# Patient Record
Sex: Male | Born: 1950
Health system: Southern US, Community
[De-identification: ages and names within clinical notes are randomized; demographics above are authoritative.]

## PROBLEM LIST (undated history)

## (undated) DIAGNOSIS — N200 Calculus of kidney: Secondary | ICD-10-CM

## (undated) DIAGNOSIS — I10 Essential (primary) hypertension: Secondary | ICD-10-CM

## (undated) HISTORY — DX: Essential (primary) hypertension: I10

## (undated) HISTORY — DX: Calculus of kidney: N20.0

---

## 1998-02-11 ENCOUNTER — Ambulatory Visit (HOSPITAL_COMMUNITY): Admission: RE | Admit: 1998-02-11 | Discharge: 1998-02-11 | Payer: Self-pay | Admitting: Gastroenterology

## 2000-08-29 ENCOUNTER — Ambulatory Visit (HOSPITAL_COMMUNITY): Admission: RE | Admit: 2000-08-29 | Discharge: 2000-08-30 | Payer: Self-pay | Admitting: Cardiology

## 2006-03-08 ENCOUNTER — Ambulatory Visit: Payer: Self-pay | Admitting: Family Medicine

## 2006-03-14 ENCOUNTER — Ambulatory Visit: Payer: Self-pay | Admitting: Family Medicine

## 2006-06-07 ENCOUNTER — Ambulatory Visit: Payer: Self-pay | Admitting: Family Medicine

## 2006-06-19 ENCOUNTER — Ambulatory Visit: Payer: Self-pay | Admitting: Family Medicine

## 2006-06-19 LAB — CONVERTED CEMR LAB
ALT: 17 units/L (ref 0–40)
Calcium: 9.1 mg/dL (ref 8.4–10.5)
Chloride: 103 meq/L (ref 96–112)
Chol/HDL Ratio, serum: 5.1
Cholesterol: 219 mg/dL (ref 0–200)
GFR calc non Af Amer: 82 mL/min
Glomerular Filtration Rate, Af Am: 99 mL/min/{1.73_m2}
Sodium: 138 meq/L (ref 135–145)
VLDL: 34 mg/dL (ref 0–40)

## 2006-07-05 ENCOUNTER — Ambulatory Visit: Payer: Self-pay | Admitting: Internal Medicine

## 2006-09-11 ENCOUNTER — Ambulatory Visit: Payer: Self-pay | Admitting: Family Medicine

## 2006-09-14 ENCOUNTER — Ambulatory Visit: Payer: Self-pay | Admitting: Family Medicine

## 2006-09-14 LAB — CONVERTED CEMR LAB
BUN: 17 mg/dL (ref 6–23)
Calcium: 9.1 mg/dL (ref 8.4–10.5)
Chloride: 102 meq/L (ref 96–112)
Cholesterol: 197 mg/dL (ref 0–200)
GFR calc non Af Amer: 73 mL/min
Glucose, Bld: 99 mg/dL (ref 70–99)
LDL Cholesterol: 116 mg/dL — ABNORMAL HIGH (ref 0–99)
PSA: 1.58 ng/mL (ref 0.10–4.00)

## 2006-09-27 DIAGNOSIS — E785 Hyperlipidemia, unspecified: Secondary | ICD-10-CM

## 2006-09-27 DIAGNOSIS — Z860100 Personal history of colon polyps, unspecified: Secondary | ICD-10-CM | POA: Insufficient documentation

## 2006-09-27 DIAGNOSIS — K5730 Diverticulosis of large intestine without perforation or abscess without bleeding: Secondary | ICD-10-CM | POA: Insufficient documentation

## 2006-09-27 DIAGNOSIS — D509 Iron deficiency anemia, unspecified: Secondary | ICD-10-CM | POA: Insufficient documentation

## 2006-09-27 DIAGNOSIS — I10 Essential (primary) hypertension: Secondary | ICD-10-CM | POA: Insufficient documentation

## 2006-09-27 DIAGNOSIS — J309 Allergic rhinitis, unspecified: Secondary | ICD-10-CM | POA: Insufficient documentation

## 2006-11-16 ENCOUNTER — Encounter (INDEPENDENT_AMBULATORY_CARE_PROVIDER_SITE_OTHER): Payer: Self-pay | Admitting: Family Medicine

## 2006-11-16 ENCOUNTER — Encounter: Payer: Self-pay | Admitting: Family Medicine

## 2006-11-16 ENCOUNTER — Telehealth (INDEPENDENT_AMBULATORY_CARE_PROVIDER_SITE_OTHER): Payer: Self-pay | Admitting: *Deleted

## 2006-11-16 ENCOUNTER — Ambulatory Visit: Payer: Self-pay | Admitting: Family Medicine

## 2007-03-01 ENCOUNTER — Ambulatory Visit: Payer: Self-pay | Admitting: Family Medicine

## 2007-04-25 ENCOUNTER — Ambulatory Visit: Payer: Self-pay | Admitting: Family Medicine

## 2007-04-29 LAB — CONVERTED CEMR LAB
CO2: 29 meq/L (ref 19–32)
Chloride: 106 meq/L (ref 96–112)
Cholesterol: 158 mg/dL (ref 0–200)
Creatinine, Ser: 1.1 mg/dL (ref 0.4–1.5)
Glucose, Bld: 102 mg/dL — ABNORMAL HIGH (ref 70–99)
HDL: 41.6 mg/dL (ref >39.0)
Potassium: 4.3 meq/L (ref 3.5–5.1)
Sodium: 140 meq/L (ref 135–145)
Triglycerides: 104 mg/dL (ref 0–149)
VLDL: 21 mg/dL (ref 0–40)

## 2007-04-30 ENCOUNTER — Encounter (INDEPENDENT_AMBULATORY_CARE_PROVIDER_SITE_OTHER): Payer: Self-pay | Admitting: *Deleted

## 2007-04-30 ENCOUNTER — Telehealth (INDEPENDENT_AMBULATORY_CARE_PROVIDER_SITE_OTHER): Payer: Self-pay | Admitting: *Deleted

## 2007-08-11 ENCOUNTER — Ambulatory Visit: Payer: Self-pay | Admitting: Family Medicine

## 2007-08-11 DIAGNOSIS — J209 Acute bronchitis, unspecified: Secondary | ICD-10-CM | POA: Insufficient documentation

## 2007-11-11 ENCOUNTER — Emergency Department (HOSPITAL_COMMUNITY): Admission: EM | Admit: 2007-11-11 | Discharge: 2007-11-12 | Payer: Self-pay | Admitting: Emergency Medicine

## 2008-01-11 ENCOUNTER — Telehealth (INDEPENDENT_AMBULATORY_CARE_PROVIDER_SITE_OTHER): Payer: Self-pay | Admitting: *Deleted

## 2008-01-23 ENCOUNTER — Encounter (INDEPENDENT_AMBULATORY_CARE_PROVIDER_SITE_OTHER): Payer: Self-pay | Admitting: *Deleted

## 2010-11-19 NOTE — Cardiovascular Report (Signed)
[facility]  Patient:    Steven Benjamin, Steven Benjamin                         MRN: [id number] Proc. Date: 08/29/00 Adm. Date:  [id number] Attending:  Armanda Magic CC:         Lilyan Punt. Sydnee Levans, M.D.   Cardiac Catheterization  PROCEDURE:  Left heart catheterization.  CARDIOLOGIST:  Armanda Magic, M.D.  INDICATIONS:  Abnormal echo and chest pain.  COMPLICATIONS:  None.  HISTORY:  This is a 60 year old white male with a history of lower extremity edema on and off for the past year.  About four weeks ago, he developed some problems with kidney stones and since then has been having some problems with left-sided chest pain with radiation to the left shoulder and neck improved with nitroglycerin.  A 2-D echocardiogram done recently showed inferior and posterior apical hypokinesis to akinesis with dilated left ventricle.  He now presents for cardiac catheterization.  DESCRIPTION OF PROCEDURE:  The patient was brought to the cardiac catheterization laboratory in the fasting state.  Informed consent was obtained.  The patient was connected to continuous heart rate and pulse oximeter monitoring and intermittent blood pressure monitoring.  The right groin was prepped and draped in a sterile fashion.  Lidocaine 1% was used for local anesthesia.  Using modified Seldinger technique, a 6-French sheath was placed in the right femoral artery.  Under fluoroscopic guidance, a 6-French JL4 catheter was placed in the left coronary artery.  Multiple cine films were taken in the RAO and LAO positions.  This catheter was then exchanged out over a guidewire for a 6-French JR4 catheter which was placed in the right coronary artery.  Multiple cine films were taken in the RAO and LAO positions.  This catheter was then exchanged over a guidewire for a 6-French angled pigtail catheter which was placed into the left ventricular cavity.  Left ventriculography was performed in the RAO and  LAO positions using 30 cc of IV contrast for the RAO projection and 24 cc for the LAO position.  The catheter was then pulled back across the aortic valve, and no significant gradient was obtained.  The catheter and sheath were then removed.  Manual compression was performed until adequate hemostasis was obtained.  The patient was then transferred back to his room in stable condition.  RESULTS:  The left main coronary artery is widely patent and bifurcates in left anterior descending and left circumflex artery.  The left anterior descending artery gives rise to a large diagonal branch which is widely patent and then two smaller diagonal branches, both of which are patent.  The left anterior descending artery traverses to the apex and is widely patent throughout its course.  The left circumflex artery gives rise to a very small first obtuse marginal branch, and then a large left circumflex artery terminates in three daughter branches, all of which are widely patent.  The right coronary artery gives rise to a right ventricular marginal branch which is widely patent.  The right coronary artery is widely patent throughout its course and then bifurcates into a posterior descending artery and posterolateral artery, both of which are widely patent.  Left ventriculography performed in the RAO and LAO position shows normal left ventricular size and function, no wall motion abnormalities.  IMPRESSION: 1. Normal coronary arteries, noncardiac chest pain. 2. Normal left ventricular size and function, no wall motion abnormalities.  PLAN:  Will discharge to home in the morning. DD:  08/29/00 TD:  08/30/00 Job: [id number]

## 2011-08-15 ENCOUNTER — Other Ambulatory Visit: Payer: Self-pay

## 2011-08-15 MED ORDER — SIMVASTATIN 20 MG PO TABS: 20.0000 mg | ORAL_TABLET | Freq: Every day | ORAL | Status: DC

## 2011-09-15 ENCOUNTER — Other Ambulatory Visit: Payer: Self-pay | Admitting: *Deleted

## 2011-09-15 MED ORDER — LISINOPRIL 20 MG PO TABS: 40.0000 mg | ORAL_TABLET | Freq: Every day | ORAL | Status: DC

## 2011-09-17 ENCOUNTER — Ambulatory Visit (INDEPENDENT_AMBULATORY_CARE_PROVIDER_SITE_OTHER): Payer: BC Managed Care – PPO | Admitting: Emergency Medicine

## 2011-09-17 ENCOUNTER — Ambulatory Visit: Payer: BC Managed Care – PPO

## 2011-09-17 VITALS — BP 200/104 | HR 71 | Temp 98.5°F | Resp 16 | Ht 65.0 in | Wt 210.0 lb

## 2011-09-17 DIAGNOSIS — E785 Hyperlipidemia, unspecified: Secondary | ICD-10-CM

## 2011-09-17 DIAGNOSIS — I10 Essential (primary) hypertension: Secondary | ICD-10-CM

## 2011-09-17 DIAGNOSIS — M79609 Pain in unspecified limb: Secondary | ICD-10-CM

## 2011-09-17 DIAGNOSIS — M79673 Pain in unspecified foot: Secondary | ICD-10-CM

## 2011-09-17 DIAGNOSIS — M79675 Pain in left toe(s): Secondary | ICD-10-CM

## 2011-09-17 LAB — COMPREHENSIVE METABOLIC PANEL
ALT: 20 U/L (ref 0–53)
AST: 26 U/L (ref 0–37)
Albumin: 4.4 g/dL (ref 3.5–5.2)
CO2: 27 mEq/L (ref 19–32)
Calcium: 9.3 mg/dL (ref 8.4–10.5)
Chloride: 102 mEq/L (ref 96–112)
Creat: 0.96 mg/dL (ref 0.50–1.35)
Potassium: 4.4 mEq/L (ref 3.5–5.3)
Total Protein: 7.5 g/dL (ref 6.0–8.3)

## 2011-09-17 LAB — URIC ACID: Uric Acid, Serum: 6.2 mg/dL (ref 4.0–7.8)

## 2011-09-17 LAB — LIPID PANEL
Cholesterol: 153 mg/dL (ref 0–200)
Total CHOL/HDL Ratio: 2.4 Ratio

## 2011-09-17 MED ORDER — SIMVASTATIN 20 MG PO TABS: 20.0000 mg | ORAL_TABLET | Freq: Every day | ORAL | Status: DC

## 2011-09-17 MED ORDER — LISINOPRIL 20 MG PO TABS: 40.0000 mg | ORAL_TABLET | Freq: Every day | ORAL | Status: DC

## 2011-09-17 NOTE — Progress Notes (Signed)
  Subjective:    Patient ID: Steven Slade Sr., male    DOB: 1950-08-06, 61 y.o.   MRN: [id number]  HPI patient was in good health until approximately 8 days ago when trying to get out of his bathtub he struck his left great toe on the side of the tub. He has had persistent pain in the MtP joint of his left great toe he also has been having discomfort in his foot he thinks because it's been difficult to walk..    Review of Systems patient under treatment for high cholesterol and high blood pressure. He states he has not been taking his medications regular because he was about to run out.     Objective:   Physical Exam  Constitutional: He appears well-developed and well-nourished.  HENT:  Head: Normocephalic.  Eyes: Pupils are equal, round, and reactive to light.  Neck: No JVD present. No tracheal deviation present. No thyromegaly present.  Cardiovascular: Normal rate, regular rhythm and normal heart sounds.   Musculoskeletal:       There is tenderness over the MTP joint of the great toe  Lymphadenopathy:    He has no cervical adenopathy.      [facility] reading (PRIMARY) by  Dr.Blayke Cordrey x-ray reveals no fracture but cystic changes of the MCP joint. These changes would be consistent with gout      Assessment & Plan:   history would say that this is injury to his foot. Check labs for gout. Have also done a lipids and cmet t because he is on a blood pressure medicine and cholesterol medicines.

## 2012-02-11 ENCOUNTER — Other Ambulatory Visit: Payer: Self-pay | Admitting: Emergency Medicine

## 2012-02-18 ENCOUNTER — Other Ambulatory Visit: Payer: Self-pay | Admitting: Emergency Medicine

## 2012-03-08 ENCOUNTER — Encounter: Payer: Self-pay | Admitting: Family Medicine

## 2012-03-08 ENCOUNTER — Ambulatory Visit (INDEPENDENT_AMBULATORY_CARE_PROVIDER_SITE_OTHER): Payer: BC Managed Care – PPO | Admitting: Family Medicine

## 2012-03-08 VITALS — BP 150/88 | HR 80 | Temp 98.1°F | Resp 16 | Ht 65.0 in | Wt 207.4 lb

## 2012-03-08 DIAGNOSIS — I10 Essential (primary) hypertension: Secondary | ICD-10-CM

## 2012-03-08 DIAGNOSIS — E785 Hyperlipidemia, unspecified: Secondary | ICD-10-CM

## 2012-03-08 DIAGNOSIS — Z760 Encounter for issue of repeat prescription: Secondary | ICD-10-CM

## 2012-03-08 MED ORDER — SIMVASTATIN 20 MG PO TABS: 20.0000 mg | ORAL_TABLET | Freq: Every day | ORAL | Status: DC

## 2012-03-08 MED ORDER — LISINOPRIL 20 MG PO TABS: 40.0000 mg | ORAL_TABLET | Freq: Every day | ORAL | Status: DC

## 2012-03-08 NOTE — Patient Instructions (Signed)
Shingles Vaccine What You Need to Know WHAT IS SHINGLES?  Shingles is a painful skin rash, often with blisters. It is also called Herpes Zoster or just Zoster.   A shingles rash usually appears on one side of the face or body and lasts from 2 to 4 weeks. Its main symptom is pain, which can be quite severe. Other symptoms of shingles can include fever, headache, chills, and upset stomach. Very rarely, a shingles infection can lead to pneumonia, hearing problems, blindness, brain inflammation (encephalitis), or death.   For about 1 person in 5, severe pain can continue even after the rash clears up. This is called post-herpetic neuralgia.   Shingles is caused by the Varicella Zoster virus. This is the same virus that causes chickenpox. Only someone who has had a case of chickenpox or rarely, has gotten chickenpox vaccine, can get shingles. The virus stays in your body. It can reappear many years later to cause a case of shingles.   You cannot catch shingles from another person with shingles. However, a person who has never had chickenpox (or chickenpox vaccine) could get chickenpox from someone with shingles. This is not very common.   Shingles is far more common in people 50 and older than in younger people. It is also more common in people whose immune systems are weakened because of a disease such as cancer or drugs such as steroids or chemotherapy.   At least 1 million people get shingles per year in the [place].  SHINGLES VACCINE  A vaccine for shingles was licensed in 2006. In clinical trials, the vaccine reduced the risk of shingles by 50%. It can also reduce the pain in people who still get shingles after being vaccinated.   A single dose of shingles vaccine is recommended for adults 60 years of age and older.  SOME PEOPLE SHOULD NOT GET SHINGLES VACCINE OR SHOULD WAIT A person should not get shingles vaccine if he or she:  Has ever had a life-threatening allergic reaction to  gelatin, the antibiotic neomycin, or any other component of shingles vaccine. Tell your caregiver if you have any severe allergies.   Has a weakened immune system because of current:   AIDS or another disease that affects the immune system.   Treatment with drugs that affect the immune system, such as prolonged use of high-dose steroids.   Cancer treatment, such as radiation or chemotherapy.   Cancer affecting the bone marrow or lymphatic system, such as leukemia or lymphoma.   Is pregnant, or might be pregnant. Women should not become pregnant until at least 4 weeks after getting shingles vaccine.  Someone with a minor illness, such as a cold, may be vaccinated. Anyone with a moderate or severe acute illness should usually wait until he or she recovers before getting the vaccine. This includes anyone with a temperature of 101.3 F (38 C) or higher. WHAT ARE THE RISKS FROM SHINGLES VACCINE?  A vaccine, like any medicine, could possibly cause serious problems, such as severe allergic reactions. However, the risk of a vaccine causing serious harm, or death, is extremely small.   No serious problems have been identified with shingles vaccine.  Mild Problems  Redness, soreness, swelling, or itching at the site of the injection (about 1 person in 3).   Headache (about 1 person in 70).  Like all vaccines, shingles vaccine is being closely monitored for unusual or severe problems. WHAT IF THERE IS A MODERATE OR SEVERE REACTION? What should I   look for? Any unusual condition, such as a severe allergic reaction or a high fever. If a severe allergic reaction occurred, it would be within a few minutes to an hour after the shot. Signs of a serious allergic reaction can include difficulty breathing, weakness, hoarseness or wheezing, a fast heartbeat, hives, dizziness, paleness, or swelling of the throat. What should I do?  Call your caregiver, or get the person to a caregiver right away.   Tell  the caregiver what happened, the date and time it happened, and when the vaccination was given.   Ask the caregiver to report the reaction by filing a Vaccine Adverse Event Reporting System (VAERS) form. Or, you can file this report through the VAERS web site at www.vaers.hhs.gov or by calling 1-800-822-7967.  VAERS does not provide medical advice. HOW CAN I LEARN MORE?  Ask your caregiver. He or she can give you the vaccine package insert or suggest other sources of information.   Contact the Centers for Disease Control and Prevention (CDC):   Call 1-800-232-4636 (1-800-CDC-INFO).   Visit the CDC website at www.cdc.gov/vaccines  CDC Shingles Vaccine VIS (04/08/08) Document Released: 04/17/2006 Document Revised: 06/09/2011 Document Reviewed: 04/08/2008 ExitCare Patient Information 2012 [employer]. 

## 2012-03-08 NOTE — Progress Notes (Signed)
S; This 61 y.o. Cauc male returns for medication refills (notification by pharmacy). He is compliant with medications without side effects; he could not tolerate Atorvastatin which caused myalgias. He denies diaphoresis, chest tightness or pain, SOB, edema, cough, HA, dizziness, numbness, weakness or syncope. 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  ROS: As per HPI.  O:  Filed Vitals:   03/08/12 1405  BP: 150/88                                                BP: 200/104 in March 2013  Pulse: 80  Temp: 98.1 F (36.7 C)  Resp: 16   GEN: In NAD; WN,WD> HENT: [place]/AT; EOMI, conj/scl clear. LUNGS: Normal resp rate and effort. COR: RRR NEURO: A&O x 3; CNs intact; otherwise, nonfocal.  LABS: Lipids- 02/2011    TChol= 240   TGs=294   HDL= 47   LDL= 134  A/P:  1. HTN (hypertension)  RF: Lisinopril 20 mg  #180  1 RF  2. Medication refill   3. Hyperlipidemia RF: Simvastatin 20 mg  #90   1RF   Advised to schedule CPE  Within next 6 months. Pt plans to do this once he relocates to [place], [employer]. RX: Zostavax- 1 dose  given to pt.

## 2014-09-25 DIAGNOSIS — Z9119 Patient's noncompliance with other medical treatment and regimen: Secondary | ICD-10-CM | POA: Diagnosis not present

## 2014-09-25 DIAGNOSIS — Z1389 Encounter for screening for other disorder: Secondary | ICD-10-CM | POA: Diagnosis not present

## 2014-09-25 DIAGNOSIS — I10 Essential (primary) hypertension: Secondary | ICD-10-CM | POA: Diagnosis not present

## 2014-09-25 DIAGNOSIS — E780 Pure hypercholesterolemia: Secondary | ICD-10-CM | POA: Diagnosis not present

## 2014-09-25 DIAGNOSIS — Z9181 History of falling: Secondary | ICD-10-CM | POA: Diagnosis not present

## 2014-09-25 DIAGNOSIS — Z2821 Immunization not carried out because of patient refusal: Secondary | ICD-10-CM | POA: Diagnosis not present

## 2014-09-25 DIAGNOSIS — Z79899 Other long term (current) drug therapy: Secondary | ICD-10-CM | POA: Diagnosis not present

## 2015-04-22 DIAGNOSIS — L989 Disorder of the skin and subcutaneous tissue, unspecified: Secondary | ICD-10-CM | POA: Diagnosis not present

## 2015-05-19 DIAGNOSIS — C44319 Basal cell carcinoma of skin of other parts of face: Secondary | ICD-10-CM | POA: Diagnosis not present

## 2015-06-23 DIAGNOSIS — J209 Acute bronchitis, unspecified: Secondary | ICD-10-CM | POA: Diagnosis not present

## 2015-06-29 ENCOUNTER — Ambulatory Visit (INDEPENDENT_AMBULATORY_CARE_PROVIDER_SITE_OTHER): Payer: Medicare Other

## 2015-06-29 ENCOUNTER — Ambulatory Visit (INDEPENDENT_AMBULATORY_CARE_PROVIDER_SITE_OTHER): Payer: Medicare Other | Admitting: Physician Assistant

## 2015-06-29 VITALS — BP 218/138 | HR 92 | Temp 98.1°F | Resp 18 | Ht 65.0 in | Wt 213.0 lb

## 2015-06-29 DIAGNOSIS — I10 Essential (primary) hypertension: Secondary | ICD-10-CM | POA: Diagnosis not present

## 2015-06-29 DIAGNOSIS — R05 Cough: Secondary | ICD-10-CM

## 2015-06-29 DIAGNOSIS — R030 Elevated blood-pressure reading, without diagnosis of hypertension: Secondary | ICD-10-CM | POA: Diagnosis not present

## 2015-06-29 DIAGNOSIS — R062 Wheezing: Secondary | ICD-10-CM

## 2015-06-29 DIAGNOSIS — IMO0001 Reserved for inherently not codable concepts without codable children: Secondary | ICD-10-CM

## 2015-06-29 DIAGNOSIS — J209 Acute bronchitis, unspecified: Secondary | ICD-10-CM

## 2015-06-29 DIAGNOSIS — R059 Cough, unspecified: Secondary | ICD-10-CM

## 2015-06-29 LAB — COMPREHENSIVE METABOLIC PANEL
ALBUMIN: 4.3 g/dL (ref 3.6–5.1)
ALT: 30 U/L (ref 9–46)
AST: 23 U/L (ref 10–35)
Alkaline Phosphatase: 69 U/L (ref 40–115)
BUN: 20 mg/dL (ref 7–25)
CALCIUM: 9.7 mg/dL (ref 8.6–10.3)
CHLORIDE: 101 mmol/L (ref 98–110)
CO2: 26 mmol/L (ref 20–31)
Creat: 0.93 mg/dL (ref 0.70–1.25)
Glucose, Bld: 100 mg/dL — ABNORMAL HIGH (ref 65–99)
Potassium: 4.5 mmol/L (ref 3.5–5.3)
SODIUM: 136 mmol/L (ref 135–146)
Total Bilirubin: 0.5 mg/dL (ref 0.2–1.2)
Total Protein: 8 g/dL (ref 6.1–8.1)

## 2015-06-29 LAB — POCT CBC
GRANULOCYTE PERCENT: 77.1 % (ref 37–80)
HEMATOCRIT: 45.4 % (ref 43.5–53.7)
Hemoglobin: 15.2 g/dL (ref 14.1–18.1)
Lymph, poc: 2.4 (ref 0.6–3.4)
MCH: 28.5 pg (ref 27–31.2)
MCHC: 33.6 g/dL (ref 31.8–35.4)
MCV: 84.9 fL (ref 80–97)
MID (CBC): 0.8 (ref 0–0.9)
MPV: 6.6 fL (ref 0–99.8)
POC GRANULOCYTE: 10.9 — AB (ref 2–6.9)
POC LYMPH %: 17.3 % (ref 10–50)
POC MID %: 5.6 %M (ref 0–12)
Platelet Count, POC: 340 10*3/uL (ref 142–424)
RBC: 5.34 M/uL (ref 4.69–6.13)
RDW, POC: 12.8 %
WBC: 14.1 10*3/uL — AB (ref 4.6–10.2)

## 2015-06-29 LAB — TROPONIN I: Troponin I: 0.03 ng/mL (ref <0.06)

## 2015-06-29 MED ORDER — AMLODIPINE BESYLATE 10 MG PO TABS: 10.0000 mg | ORAL_TABLET | Freq: Every day | ORAL | Status: DC

## 2015-06-29 MED ORDER — DOXYCYCLINE HYCLATE 100 MG PO CAPS: 100.0000 mg | ORAL_CAPSULE | Freq: Two times a day (BID) | ORAL | Status: AC

## 2015-06-29 MED ORDER — IPRATROPIUM BROMIDE 0.02 % IN SOLN
0.5000 mg | Freq: Once | RESPIRATORY_TRACT | Status: AC
Start: 2015-06-29 — End: 2015-06-29
  Administered 2015-06-29: 0.5 mg via RESPIRATORY_TRACT

## 2015-06-29 MED ORDER — PREDNISONE 20 MG PO TABS: ORAL_TABLET | ORAL | Status: DC

## 2015-06-29 MED ORDER — ALBUTEROL SULFATE HFA 108 (90 BASE) MCG/ACT IN AERS: 2.0000 | INHALATION_SPRAY | RESPIRATORY_TRACT | Status: DC | PRN

## 2015-06-29 MED ORDER — HYDROCOD POLST-CPM POLST ER 10-8 MG/5ML PO SUER: 5.0000 mL | Freq: Two times a day (BID) | ORAL | Status: DC | PRN

## 2015-06-29 MED ORDER — ALBUTEROL SULFATE (2.5 MG/3ML) 0.083% IN NEBU
2.5000 mg | INHALATION_SOLUTION | Freq: Once | RESPIRATORY_TRACT | Status: AC
Start: 2015-06-29 — End: 2015-06-29
  Administered 2015-06-29: 2.5 mg via RESPIRATORY_TRACT

## 2015-06-29 NOTE — Patient Instructions (Signed)
Stop the levaquin. Start doxycycline twice a day for 10 days. Eat yogurt or take probiotic to avoid GI upset. Cough at night. Albuterol inhaler scheduled every 4 hours for the next 2 days, then as needed for wheezing. Prednisone as directed in the mornings.  Take 1/2 tab amlodipine for 4 days, then increase to full tab. Return in 1 week for follow up blood pressure.

## 2015-06-29 NOTE — Progress Notes (Signed)
[facility] [address],  [place] [address] [phone]  Date:  06/29/2015   Name:  Steven KOCIK Sr.   DOB:  [birth year]   MRN:  [id number]  PCP:  Robyn Haber, MD    Chief Complaint: Cough; Wheezing; and Fever   History of Present Illness:  This is a 64 y.o. male with PMH HLD, anemia, allergic rhinitis who is presenting with cough and wheezing x 2 weeks. Was seen at an Urgent Care in [place] 1 week ago and given levaquin 500 mg QD x 10 days. Has 4 more tabs left. He was also given steroid injection. He feels his symptoms have worsened since starting. Cough is somewhat productive. Having nasal congestion, right otalgia. Denies sore throat. He has had intermittent subj fever although wife states temp usually 97-98. No history of asthma. Has had wheezing before with bronchitis.  One week ago BP 202/170s when he was seen at urgent car for cough. Didn't have an EKG done. He states the provider wanted to send him to the ED but eventually sent him home since his BP came down some on recheck. Is having some CP with coughing only. Denies sob, blurred vision, headache, dizziness, palps. No problems with activity. He has a PCP in [place], Dr. Lin Landsman. Sees him 1-2x per year. Takes lisinopril 40 mg QD. States he was on HCTZ 12.5 mg at one time but no longer because "it didn't work". No hx CAD. He used to be a patient here 3 years ago. He is interested in making [place] his primary care again.  Review of Systems:  Review of Systems See HPI  Patient Active Problem List   Diagnosis Date Noted  . ACUTE BRONCHITIS 08/11/2007  . HYPERLIPIDEMIA 09/27/2006  . ANEMIA-IRON DEFICIENCY 09/27/2006  . HYPERTENSION 09/27/2006  . ALLERGIC RHINITIS 09/27/2006  . DIVERTICULOSIS, COLON 09/27/2006  . COLONIC POLYPS, HX OF 09/27/2006    Prior to Admission medications   Medication Sig Start Date End Date Taking? Authorizing Provider  cetirizine (ZYRTEC) 10 MG tablet Take 10 mg by mouth  daily.   Yes Historical Provider, MD  levofloxacin (LEVAQUIN) 500 MG tablet Take 500 mg by mouth daily.   Yes Historical Provider, MD  lisinopril (PRINIVIL,ZESTRIL) 20 MG tablet Take 2 tablets (40 mg total) by mouth daily. 03/08/12  Yes Barton Fanny, MD  simvastatin (ZOCOR) 20 MG tablet Take 1 tablet (20 mg total) by mouth at bedtime. 03/08/12  Yes Barton Fanny, MD    Allergies  Allergen Reactions  . Lipitor [Atorvastatin Calcium] Other (See Comments)    Muscle pains  . Z-Pak [Azithromycin]   . Erythromycin Rash    History reviewed. No pertinent past surgical history.  Social History  Substance Use Topics  . Smoking status: Former Research scientist (life sciences)  . Smokeless tobacco: None  . Alcohol Use: No    Family History  Problem Relation Age of Onset  . Cancer Mother   . Heart disease Father     Medication list has been reviewed and updated.  Physical Examination:  Physical Exam  Constitutional: He is oriented to person, place, and time. He appears well-developed and well-nourished. No distress.  HENT:  Head: Normocephalic and atraumatic.  Right Ear: Hearing, tympanic membrane, external ear and ear canal normal.  Left Ear: Hearing, tympanic membrane, external ear and ear canal normal.  Nose: Nose normal. Right sinus exhibits no maxillary sinus tenderness and no frontal sinus tenderness. Left sinus exhibits no maxillary sinus tenderness and no  frontal sinus tenderness.  Mouth/Throat: Uvula is midline, oropharynx is clear and moist and mucous membranes are normal.  Eyes: Conjunctivae and lids are normal. Right eye exhibits no discharge. Left eye exhibits no discharge. No scleral icterus.  Cardiovascular: Normal rate, regular rhythm, normal heart sounds and normal pulses.   No murmur heard. Pulmonary/Chest: Effort normal. No respiratory distress. He has wheezes (throughout). He has rhonchi. He has no rales.  Wheezing and symptoms mildly improved after duoneb  Musculoskeletal: Normal  range of motion.  Lymphadenopathy:       Head (right side): No submental, no submandibular and no tonsillar adenopathy present.       Head (left side): No submental, no submandibular and no tonsillar adenopathy present.    He has no cervical adenopathy.  Neurological: He is alert and oriented to person, place, and time.  Skin: Skin is warm, dry and intact. No lesion and no rash noted.  Psychiatric: He has a normal mood and affect. His speech is normal and behavior is normal. Thought content normal.   BP 180/124 mmHg  Pulse 94  Temp(Src) 98.1 F (36.7 C) (Oral)  Resp 18  Ht 5\' 5"  (1.651 m)  Wt 213 lb (96.616 kg)  BMI 35.44 kg/m2  SpO2 96%  Results for orders placed or performed in visit on 06/29/15  POCT CBC  Result Value Ref Range   WBC 14.1 (A) 4.6 - 10.2 K/uL   Lymph, poc 2.4 0.6 - 3.4   POC LYMPH PERCENT 17.3 10 - 50 %L   MID (cbc) 0.8 0 - 0.9   POC MID % 5.6 0 - 12 %M   POC Granulocyte 10.9 (A) 2 - 6.9   Granulocyte percent 77.1 37 - 80 %G   RBC 5.34 4.69 - 6.13 M/uL   Hemoglobin 15.2 14.1 - 18.1 g/dL   HCT, POC 45.4 43.5 - 53.7 %   MCV 84.9 80 - 97 fL   MCH, POC 28.5 27 - 31.2 pg   MCHC 33.6 31.8 - 35.4 g/dL   RDW, POC 12.8 %   Platelet Count, POC 340 142 - 424 K/uL   MPV 6.6 0 - 99.8 fL   EKG interpreted with Dr. Lorelei Pont: NSR with possible left atrial enlargement. No signs of ischemia  [facility] reading (PRIMARY) by  Dr. Lorelei Pont: negative  Assessment and Plan:  1. Elevated BP 2. Essential hypertension BP very elevated. EKG without ischemia. Troponin negative. He has been taking lisinopril 40 mg QD. Will add amlodipine - he will take 5 mg QD x 4 days and then increase in 10 mg QD thereafter. Return in 1 week for recheck. He plans to transfer his primary care to our office. - Comprehensive metabolic panel - EKG [id number]  3. Acute bronchitis, unspecified organism 4. Wheezing 5. Cough Worsening cough and wheezing despite being on levaquin. CXR negative. Duoneb with  some improvement in wheezing. Switch to doxy, stop levo. rx'd oral prednisone taper. He will schedule albuterol every 4 hours for next 48 hours and then prn thereafter. Will follow up in 1 week when we recheck his BP. Return before if symptoms change/worsen. - albuterol (PROVENTIL) (2.5 MG/3ML) 0.083% nebulizer solution 2.5 mg; Take 3 mLs (2.5 mg total) by nebulization once. - ipratropium (ATROVENT) nebulizer solution 0.5 mg; Take 2.5 mLs (0.5 mg total) by nebulization once. - POCT CBC - DG Chest 2 View; Future   Benjaman Pott. Drenda Freeze, MHS [facility]  06/29/2015

## 2015-07-02 ENCOUNTER — Telehealth: Payer: Self-pay | Admitting: Physician Assistant

## 2015-07-02 NOTE — Telephone Encounter (Signed)
Spoke with patient. His cough is getting much better. He does not have a monitor at home so has not been checking BP but states he is feeling ok and doing well on amlodipine so far.  He is going to return on 07/07/15 to follow up with Tor Netters.

## 2015-07-07 ENCOUNTER — Ambulatory Visit (INDEPENDENT_AMBULATORY_CARE_PROVIDER_SITE_OTHER): Payer: Medicare Other | Admitting: Family Medicine

## 2015-07-07 VITALS — BP 155/87 | HR 105 | Temp 99.3°F | Resp 16 | Ht 65.25 in | Wt 211.4 lb

## 2015-07-07 DIAGNOSIS — I10 Essential (primary) hypertension: Secondary | ICD-10-CM

## 2015-07-07 DIAGNOSIS — J209 Acute bronchitis, unspecified: Secondary | ICD-10-CM
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
## 2015-07-11 ENCOUNTER — Encounter: Payer: Self-pay | Admitting: Family Medicine

## 2015-07-22 DIAGNOSIS — Z85828 Personal history of other malignant neoplasm of skin: Secondary | ICD-10-CM | POA: Diagnosis not present

## 2015-08-11 ENCOUNTER — Encounter: Payer: Medicare Other | Admitting: Family Medicine

## 2015-08-14 ENCOUNTER — Ambulatory Visit (INDEPENDENT_AMBULATORY_CARE_PROVIDER_SITE_OTHER): Payer: Medicare Other

## 2015-08-14 ENCOUNTER — Ambulatory Visit (INDEPENDENT_AMBULATORY_CARE_PROVIDER_SITE_OTHER): Payer: Medicare Other | Admitting: Family Medicine

## 2015-08-14 VITALS — BP 132/80 | HR 112 | Temp 98.7°F | Resp 20 | Ht 65.75 in | Wt 213.6 lb

## 2015-08-14 DIAGNOSIS — R109 Unspecified abdominal pain: Secondary | ICD-10-CM | POA: Diagnosis not present

## 2015-08-14 DIAGNOSIS — Z87442 Personal history of urinary calculi: Secondary | ICD-10-CM | POA: Diagnosis not present

## 2015-08-14 DIAGNOSIS — S39012A Strain of muscle, fascia and tendon of lower back, initial encounter: Secondary | ICD-10-CM | POA: Diagnosis not present

## 2015-08-14 LAB — POCT URINALYSIS DIP (MANUAL ENTRY)
BILIRUBIN UA: NEGATIVE
Bilirubin, UA: NEGATIVE
Glucose, UA: NEGATIVE
Nitrite, UA: NEGATIVE
PH UA: 5.5
RBC UA: NEGATIVE
SPEC GRAV UA: 1.01
Urobilinogen, UA: 0.2

## 2015-08-14 LAB — POC MICROSCOPIC URINALYSIS (UMFC): Mucus: ABSENT

## 2015-08-14 MED ORDER — HYDROCODONE-ACETAMINOPHEN 5-325 MG PO TABS: 1.0000 | ORAL_TABLET | Freq: Four times a day (QID) | ORAL | Status: DC | PRN

## 2015-08-14 NOTE — Patient Instructions (Signed)
Because you received an x-ray today, you will receive an invoice from [facility]. Please contact [facility] at [phone] with questions or concerns regarding your invoice. Our billing staff will not be able to assist you with those questions. °

## 2015-08-14 NOTE — Progress Notes (Signed)
Subjective:    Patient ID: Steven Polio Sr., male    DOB: 1950/10/05, 65 y.o.   MRN: [id number]  08/14/2015  Nephrolithiasis   HPI This 65 y.o. male presents for evaluation of R>L back pain sharp pain.  Has suffered with eight previous kidney stones.  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  Passed one last years ago.  With recent xray had 5 stones in kidney.  First one was in 1977.  No fever/chills/sweats. No n/v.  No dysuria; can tell has kidney stone.  No hematuria.  +urgency.  +frequency chronic; slight worsening today and last night.  Nocturia x 2; baseline is none.  Pain was constant; radiating to front some; radiating to side of flank; also having L sided pain.  Took 3 Aleve at 3:00pm.  Has not seen [facility] in ten years.   Feels just like his previous kidney stones.      Review of Systems  Constitutional: Negative for fever, chills, diaphoresis, activity change, appetite change and fatigue.  Respiratory: Negative for cough and shortness of breath.   Cardiovascular: Negative for chest pain, palpitations and leg swelling.  Gastrointestinal: Negative for nausea, vomiting, abdominal pain and diarrhea.  Endocrine: Negative for cold intolerance, heat intolerance, polydipsia, polyphagia and polyuria.  Genitourinary: Positive for urgency, frequency and flank pain. Negative for dysuria, hematuria, penile swelling, scrotal swelling, penile pain and testicular pain.  Musculoskeletal: Positive for myalgias and back pain.  Skin: Negative for color change, rash and wound.  Neurological: Negative for dizziness, tremors, seizures, syncope, facial asymmetry, speech difficulty, weakness, light-headedness, numbness and headaches.  Psychiatric/Behavioral: Negative for sleep disturbance and dysphoric mood. The patient is not nervous/anxious.     Past Medical History  Diagnosis Date  . Hypertension   . Kidney stone    History reviewed. No pertinent past surgical history. Allergies  Allergen Reactions  .  Lipitor [Atorvastatin Calcium] Other (See Comments)    Muscle pains  . Z-Pak [Azithromycin]   . Erythromycin Rash    Social History   Social History  . Marital Status: Married    Spouse Name: N/A  . Number of Children: N/A  . Years of Education: N/A   Occupational History  . Not on file.   Social History Main Topics  . Smoking status: Former Research scientist (life sciences)  . Smokeless tobacco: Not on file  . Alcohol Use: No  . Drug Use: No  . Sexual Activity: Not on file   Other Topics Concern  . Not on file   Social History Narrative   Family History  Problem Relation Age of Onset  . Cancer Mother   . Heart disease Father        Objective:    BP 132/80 mmHg  Pulse 112  Temp(Src) 98.7 F (37.1 C) (Oral)  Resp 20  Ht 5' 5.75" (1.67 m)  Wt 213 lb 9.6 oz (96.888 kg)  BMI 34.74 kg/m2  SpO2 98% Physical Exam  Constitutional: He is oriented to person, place, and time. He appears well-developed and well-nourished. No distress.  HENT:  Head: Normocephalic and atraumatic.  Eyes: Conjunctivae and EOM are normal. Pupils are equal, round, and reactive to light.  Neck: Normal range of motion. Neck supple. Carotid bruit is not present. No thyromegaly present.  Cardiovascular: Normal rate, regular rhythm, normal heart sounds and intact distal pulses.  Exam reveals no gallop and no friction rub.   No murmur heard. Pulmonary/Chest: Effort normal and breath sounds normal. He has no wheezes. He has no rales.  Musculoskeletal:  Lumbar back: He exhibits decreased range of motion and pain. He exhibits no tenderness, no bony tenderness, no spasm and normal pulse.  Lymphadenopathy:    He has no cervical adenopathy.  Neurological: He is alert and oriented to person, place, and time. No cranial nerve deficit.  Skin: Skin is warm and dry. No rash noted. He is not diaphoretic.  Psychiatric: He has a normal mood and affect. His behavior is normal.  Nursing note and vitals reviewed.  Results for  orders placed or performed in visit on 08/14/15  Urine culture  Result Value Ref Range   Colony Count NO GROWTH    Organism ID, Bacteria NO GROWTH   POCT urinalysis dipstick  Result Value Ref Range   Color, UA yellow yellow   Clarity, UA cloudy (A) clear   Glucose, UA negative negative   Bilirubin, UA negative negative   Ketones, POC UA negative negative   Spec Grav, UA 1.010    Blood, UA negative negative   pH, UA 5.5    Protein Ur, POC =30 (A) negative   Urobilinogen, UA 0.2    Nitrite, UA Negative Negative   Leukocytes, UA Trace (A) Negative  POCT Microscopic Urinalysis ([facility])  Result Value Ref Range   WBC,UR,HPF,POC Few (A) None WBC/hpf   RBC,UR,HPF,POC None None RBC/hpf   Bacteria None None, Too numerous to count   Mucus Absent Absent   Epithelial Cells, UR Per Microscopy None None, Too numerous to count cells/hpf       Assessment & Plan:   1. Lumbar strain, initial encounter   2. Flank pain   3. History of nephrolithiasis    -New. -Rx for hydrocodone provided. -no evidence of acute nephrolithiasis.   -if no improvement in back pain in upcoming 1-2 weeks, RTC for lumbar spine films.   -Heat to back bid for 15 minutes.   Orders Placed This Encounter  Procedures  . Urine culture  . DG Abd 1 View    Standing Status: Future     Number of Occurrences: 1     Standing Expiration Date: 08/13/2016    Order Specific Question:  Reason for Exam (SYMPTOM  OR DIAGNOSIS REQUIRED)    Answer:  R>L back pain; history of kidney stones    Order Specific Question:  Preferred imaging location?    Answer:  External  . POCT urinalysis dipstick  . POCT Microscopic Urinalysis ([facility])   Meds ordered this encounter  Medications  . HYDROcodone-acetaminophen (NORCO/VICODIN) 5-325 MG tablet    Sig: Take 1 tablet by mouth every 6 (six) hours as needed for moderate pain.    Dispense:  30 tablet    Refill:  0    No Follow-up on file.    Kristi Elayne Guerin, M.D. [facility] [address] [place], [place]  [address] [phone] phone [phone] fax

## 2015-08-16 LAB — URINE CULTURE
COLONY COUNT: NO GROWTH
Organism ID, Bacteria: NO GROWTH

## 2015-10-21 DIAGNOSIS — Z85828 Personal history of other malignant neoplasm of skin: Secondary | ICD-10-CM | POA: Diagnosis not present

## 2015-11-09 ENCOUNTER — Ambulatory Visit (INDEPENDENT_AMBULATORY_CARE_PROVIDER_SITE_OTHER): Payer: Medicare Other | Admitting: Family Medicine

## 2015-11-09 VITALS — BP 166/90 | HR 66 | Temp 97.8°F | Resp 16 | Ht 65.5 in | Wt 224.8 lb

## 2015-11-09 DIAGNOSIS — I10 Essential (primary) hypertension: Secondary | ICD-10-CM | POA: Diagnosis not present

## 2015-11-09 DIAGNOSIS — E785 Hyperlipidemia, unspecified: Secondary | ICD-10-CM | POA: Diagnosis not present

## 2015-11-09 MED ORDER — LISINOPRIL 40 MG PO TABS: 40.0000 mg | ORAL_TABLET | Freq: Every day | ORAL | Status: DC

## 2015-11-09 MED ORDER — SIMVASTATIN 20 MG PO TABS: 20.0000 mg | ORAL_TABLET | Freq: Every day | ORAL | Status: DC

## 2015-11-09 MED ORDER — METOPROLOL SUCCINATE ER 50 MG PO TB24: 50.0000 mg | ORAL_TABLET | Freq: Every day | ORAL | Status: DC

## 2015-11-09 MED ORDER — TRIAMTERENE-HCTZ 37.5-25 MG PO TABS: 1.0000 | ORAL_TABLET | Freq: Every day | ORAL | Status: DC

## 2015-11-09 NOTE — Progress Notes (Signed)
This is 65 year old man who is semiretired. 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  Patient has had long history of hypertension. He has no heart disease however. He denies any chest pain, shortness of breath, dizzy spells. He does have significant edema however. This is gotten worse since he started his amlodipine.  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 appearance is that of an overweight gentleman in no acute distress with marked 3+ pedal edema extending up to the area just below his knee. HEENT: Unremarkable Neck: Supple no adenopathy or JVD or thyromegaly Chest: Clear Heart: Regular, no murmur or gallop Abdomen: Soft nontender Extremities: Edema as noted above  Assessment: Marked pedal edema with uncontrolled hypertension, and I think that the edema is probably coming from amlodipine. Patient is a first to taking Lasix but has agreed to take hydrochlorothiazide. He also has hyperlipidemia.  Plan:Essential hypertension - Plan: lisinopril (PRINIVIL,ZESTRIL) 40 MG tablet, triamterene-hydrochlorothiazide (MAXZIDE-25) 37.5-25 MG tablet, metoprolol succinate (TOPROL-XL) 50 MG 24 hr tablet  Hyperlipidemia - Plan: simvastatin (ZOCOR) 20 MG tablet  Patient slightly notes the edema does not subside.

## 2015-11-09 NOTE — Patient Instructions (Addendum)
Hypertension Hypertension, commonly called high blood pressure, is when the force of blood pumping through your arteries is too strong. Your arteries are the blood vessels that carry blood from your heart throughout your body. A blood pressure reading consists of a higher number over a lower number, such as 110/72. The higher number (systolic) is the pressure inside your arteries when your heart pumps. The lower number (diastolic) is the pressure inside your arteries when your heart relaxes. Ideally you want your blood pressure below 120/80. Hypertension forces your heart to work harder to pump blood. Your arteries may become narrow or stiff. Having untreated or uncontrolled hypertension can cause heart attack, stroke, kidney disease, and other problems. RISK FACTORS Some risk factors for high blood pressure are controllable. Others are not.  Risk factors you cannot control include:   Race. You may be at higher risk if you are African American.  Age. Risk increases with age.  Gender. Men are at higher risk than women before age 45 years. After age 65, women are at higher risk than men. Risk factors you can control include:  Not getting enough exercise or physical activity.  Being overweight.  Getting too much fat, sugar, calories, or salt in your diet.  Drinking too much alcohol. SIGNS AND SYMPTOMS Hypertension does not usually cause signs or symptoms. Extremely high blood pressure (hypertensive crisis) may cause headache, anxiety, shortness of breath, and nosebleed. DIAGNOSIS To check if you have hypertension, your health care provider will measure your blood pressure while you are seated, with your arm held at the level of your heart. It should be measured at least twice using the same arm. Certain conditions can cause a difference in blood pressure between your right and left arms. A blood pressure reading that is higher than normal on one occasion does not mean that you need treatment. If  it is not clear whether you have high blood pressure, you may be asked to return on a different day to have your blood pressure checked again. Or, you may be asked to monitor your blood pressure at home for 1 or more weeks. TREATMENT Treating high blood pressure includes making lifestyle changes and possibly taking medicine. Living a healthy lifestyle can help lower high blood pressure. You may need to change some of your habits. Lifestyle changes may include:  Following the DASH diet. This diet is high in fruits, vegetables, and whole grains. It is low in salt, red meat, and added sugars.  Keep your sodium intake below 2,300 mg per day.  Getting at least 30-45 minutes of aerobic exercise at least 4 times per week.  Losing weight if necessary.  Not smoking.  Limiting alcoholic beverages.  Learning ways to reduce stress. Your health care provider may prescribe medicine if lifestyle changes are not enough to get your blood pressure under control, and if one of the following is true:  You are 18-59 years of age and your systolic blood pressure is above 140.  You are 60 years of age or older, and your systolic blood pressure is above 150.  Your diastolic blood pressure is above 90.  You have diabetes, and your systolic blood pressure is over 140 or your diastolic blood pressure is over 90.  You have kidney disease and your blood pressure is above 140/90.  You have heart disease and your blood pressure is above 140/90. Your personal target blood pressure may vary depending on your medical conditions, your age, and other factors. HOME CARE INSTRUCTIONS    Have your blood pressure rechecked as directed by your health care provider.   Take medicines only as directed by your health care provider. Follow the directions carefully. Blood pressure medicines must be taken as prescribed. The medicine does not work as well when you skip doses. Skipping doses also puts you at risk for  problems.  Do not smoke.   Monitor your blood pressure at home as directed by your health care provider. SEEK MEDICAL CARE IF:   You think you are having a reaction to medicines taken.  You have recurrent headaches or feel dizzy.  You have swelling in your ankles.  You have trouble with your vision. SEEK IMMEDIATE MEDICAL CARE IF:  You develop a severe headache or confusion.  You have unusual weakness, numbness, or feel faint.  You have severe chest or abdominal pain.  You vomit repeatedly.  You have trouble breathing. MAKE SURE YOU:   Understand these instructions.  Will watch your condition.  Will get help right away if you are not doing well or get worse.   This information is not intended to replace advice given to you by your health care provider. Make sure you discuss any questions you have with your health care provider.   Document Released: 06/20/2005 Document Revised: 11/04/2014 Document Reviewed: 04/12/2013 Elsevier Interactive Patient Education 2016 Elsevier Inc.  

## 2016-04-20 DIAGNOSIS — L570 Actinic keratosis: Secondary | ICD-10-CM | POA: Diagnosis not present

## 2016-04-20 DIAGNOSIS — Z85828 Personal history of other malignant neoplasm of skin: Secondary | ICD-10-CM | POA: Diagnosis not present

## 2016-11-03 ENCOUNTER — Telehealth: Payer: Self-pay | Admitting: General Practice

## 2016-11-03 ENCOUNTER — Ambulatory Visit (INDEPENDENT_AMBULATORY_CARE_PROVIDER_SITE_OTHER): Payer: Medicare Other | Admitting: Family Medicine

## 2016-11-03 ENCOUNTER — Encounter: Payer: Self-pay | Admitting: Family Medicine

## 2016-11-03 ENCOUNTER — Telehealth: Payer: Self-pay | Admitting: *Deleted

## 2016-11-03 VITALS — BP 166/116 | HR 65 | Temp 97.5°F | Resp 16 | Ht 65.0 in | Wt 214.0 lb

## 2016-11-03 DIAGNOSIS — R600 Localized edema: Secondary | ICD-10-CM | POA: Diagnosis not present

## 2016-11-03 DIAGNOSIS — E785 Hyperlipidemia, unspecified: Secondary | ICD-10-CM | POA: Diagnosis not present

## 2016-11-03 DIAGNOSIS — I10 Essential (primary) hypertension: Secondary | ICD-10-CM | POA: Diagnosis not present

## 2016-11-03 MED ORDER — SIMVASTATIN 20 MG PO TABS: 20.0000 mg | ORAL_TABLET | Freq: Every day | ORAL | 1 refills | Status: DC

## 2016-11-03 MED ORDER — TRIAMTERENE-HCTZ 37.5-25 MG PO TABS: 1.0000 | ORAL_TABLET | Freq: Every day | ORAL | 1 refills | Status: DC

## 2016-11-03 MED ORDER — LISINOPRIL 40 MG PO TABS: 40.0000 mg | ORAL_TABLET | Freq: Every day | ORAL | 1 refills | Status: DC

## 2016-11-03 MED ORDER — METOPROLOL SUCCINATE ER 50 MG PO TB24: 50.0000 mg | ORAL_TABLET | Freq: Every day | ORAL | 1 refills | Status: DC

## 2016-11-03 MED ORDER — METOPROLOL SUCCINATE ER 25 MG PO TB24: 25.0000 mg | ORAL_TABLET | Freq: Every day | ORAL | 1 refills | Status: DC

## 2016-11-03 NOTE — Patient Instructions (Addendum)
Your blood pressure is too high. Although we did get it to come down a little bit in the office, it is still running higher than it should be. Continue triamterene/HCTZ once per day, lisinopril once per day, Toprol 50 mg once per day, and take an additional Toprol 25 mg pill once per day.   Watch for lightheadedness or dizziness with this medication, and if you do experience those symptoms, return right away. Monitor your blood pressures outside of the office and keep a record for your next visit. Follow-up with me within 2 weeks. I will also check electrolytes, thyroid tests, and cholesterol test. Continue same cholesterol medicine for now.  Return to the clinic or go to the nearest emergency room if any of your symptoms worsen or new symptoms occur.   Hypertension Hypertension, commonly called high blood pressure, is when the force of blood pumping through the arteries is too strong. The arteries are the blood vessels that carry blood from the heart throughout the body. Hypertension forces the heart to work harder to pump blood and may cause arteries to become narrow or stiff. Having untreated or uncontrolled hypertension can cause heart attacks, strokes, kidney disease, and other problems. A blood pressure reading consists of a higher number over a lower number. Ideally, your blood pressure should be below 120/80. The first ("top") number is called the systolic pressure. It is a measure of the pressure in your arteries as your heart beats. The second ("bottom") number is called the diastolic pressure. It is a measure of the pressure in your arteries as the heart relaxes. What are the causes? The cause of this condition is not known. What increases the risk? Some risk factors for high blood pressure are under your control. Others are not. Factors you can change   Smoking.  Having type 2 diabetes mellitus, high cholesterol, or both.  Not getting enough exercise or physical activity.  Being  overweight.  Having too much fat, sugar, calories, or salt (sodium) in your diet.  Drinking too much alcohol. Factors that are difficult or impossible to change   Having chronic kidney disease.  Having a family history of high blood pressure.  Age. Risk increases with age.  Race. You may be at higher risk if you are African-American.  Gender. Men are at higher risk than women before age 69. After age 48, women are at higher risk than men.  Having obstructive sleep apnea.  Stress. What are the signs or symptoms? Extremely high blood pressure (hypertensive crisis) may cause:  Headache.  Anxiety.  Shortness of breath.  Nosebleed.  Nausea and vomiting.  Severe chest pain.  Jerky movements you cannot control (seizures). How is this diagnosed? This condition is diagnosed by measuring your blood pressure while you are seated, with your arm resting on a surface. The cuff of the blood pressure monitor will be placed directly against the skin of your upper arm at the level of your heart. It should be measured at least twice using the same arm. Certain conditions can cause a difference in blood pressure between your right and left arms. Certain factors can cause blood pressure readings to be lower or higher than normal (elevated) for a short period of time:  When your blood pressure is higher when you are in a health care provider's office than when you are at home, this is called white coat hypertension. Most people with this condition do not need medicines.  When your blood pressure is higher at home  than when you are in a health care provider's office, this is called masked hypertension. Most people with this condition may need medicines to control blood pressure. If you have a high blood pressure reading during one visit or you have normal blood pressure with other risk factors:  You may be asked to return on a different day to have your blood pressure checked again.  You may  be asked to monitor your blood pressure at home for 1 week or longer. If you are diagnosed with hypertension, you may have other blood or imaging tests to help your health care provider understand your overall risk for other conditions. How is this treated? This condition is treated by making healthy lifestyle changes, such as eating healthy foods, exercising more, and reducing your alcohol intake. Your health care provider may prescribe medicine if lifestyle changes are not enough to get your blood pressure under control, and if:  Your systolic blood pressure is above 130.  Your diastolic blood pressure is above 80. Your personal target blood pressure may vary depending on your medical conditions, your age, and other factors. Follow these instructions at home: Eating and drinking   Eat a diet that is high in fiber and potassium, and low in sodium, added sugar, and fat. An example eating plan is called the DASH (Dietary Approaches to Stop Hypertension) diet. To eat this way:  Eat plenty of fresh fruits and vegetables. Try to fill half of your plate at each meal with fruits and vegetables.  Eat whole grains, such as whole wheat pasta, brown rice, or whole grain bread. Fill about one quarter of your plate with whole grains.  Eat or drink low-fat dairy products, such as skim milk or low-fat yogurt.  Avoid fatty cuts of meat, processed or cured meats, and poultry with skin. Fill about one quarter of your plate with lean proteins, such as fish, chicken without skin, beans, eggs, and tofu.  Avoid premade and processed foods. These tend to be higher in sodium, added sugar, and fat.  Reduce your daily sodium intake. Most people with hypertension should eat less than 1,500 mg of sodium a day.  Limit alcohol intake to no more than 1 drink a day for nonpregnant women and 2 drinks a day for men. One drink equals 12 oz of beer, 5 oz of wine, or 1 oz of hard liquor. Lifestyle   Work with your health  care provider to maintain a healthy body weight or to lose weight. Ask what an ideal weight is for you.  Get at least 30 minutes of exercise that causes your heart to beat faster (aerobic exercise) most days of the week. Activities may include walking, swimming, or biking.  Include exercise to strengthen your muscles (resistance exercise), such as pilates or lifting weights, as part of your weekly exercise routine. Try to do these types of exercises for 30 minutes at least 3 days a week.  Do not use any products that contain nicotine or tobacco, such as cigarettes and e-cigarettes. If you need help quitting, ask your health care provider.  Monitor your blood pressure at home as told by your health care provider.  Keep all follow-up visits as told by your health care provider. This is important. Medicines   Take over-the-counter and prescription medicines only as told by your health care provider. Follow directions carefully. Blood pressure medicines must be taken as prescribed.  Do not skip doses of blood pressure medicine. Doing this puts you at risk  for problems and can make the medicine less effective.  Ask your health care provider about side effects or reactions to medicines that you should watch for. Contact a health care provider if:  You think you are having a reaction to a medicine you are taking.  You have headaches that keep coming back (recurring).  You feel dizzy.  You have swelling in your ankles.  You have trouble with your vision. Get help right away if:  You develop a severe headache or confusion.  You have unusual weakness or numbness.  You feel faint.  You have severe pain in your chest or abdomen.  You vomit repeatedly.  You have trouble breathing. Summary  Hypertension is when the force of blood pumping through your arteries is too strong. If this condition is not controlled, it may put you at risk for serious complications.  Your personal target  blood pressure may vary depending on your medical conditions, your age, and other factors. For most people, a normal blood pressure is less than 120/80.  Hypertension is treated with lifestyle changes, medicines, or a combination of both. Lifestyle changes include weight loss, eating a healthy, low-sodium diet, exercising more, and limiting alcohol. This information is not intended to replace advice given to you by your health care provider. Make sure you discuss any questions you have with your health care provider. Document Released: 06/20/2005 Document Revised: 05/18/2016 Document Reviewed: 05/18/2016 Elsevier Interactive Patient Education  2017 [employer].     IF you received an x-ray today, you will receive an invoice from [facility]. Please contact [facility] at [phone] with questions or concerns regarding your invoice.   IF you received labwork today, you will receive an invoice from [facility]. Please contact [facility] at [phone] with questions or concerns regarding your invoice.   Our billing staff will not be able to assist you with questions regarding bills from these companies.  You will be contacted with the lab results as soon as they are available. The fastest way to get your results is to activate your My Chart account. Instructions are located on the last page of this paperwork. If you have not heard from [place] regarding the results in 2 weeks, please contact this office.

## 2016-11-03 NOTE — Progress Notes (Signed)
By signing my name below, I, Mesha Guinyard, attest that this documentation has been prepared under the direction and in the presence of [name], MD.  Electronically Signed: Verlee Monte, Medical Scribe. 11/03/16. 12:32 PM.  Subjective:    Patient ID: Steven Polio Sr., male    DOB: 01/05/1951, 66 y.o.   MRN: [id number]  HPI Chief Complaint  Patient presents with  . Medication Refill    all on list    HPI Comments: Steven HERDA Sr. is a 66 y.o. male who presents to Primary Care at [facility] for medication refill. Last visit here was 11/2015 with Dr. Joseph Art.  HTN: BP was 166/90 at last office visit a year ago, he was taking amlodipine and had significant pedal edema with 3+ pitting edema to just below his knees. Stopped amlodipine and started maxzide 37.5/25 mg QD, continued on  toprol-xl 50 mg QD, and lisinopril 40 mg QD. He has not had blood work since 2016 but creatinine nl at that time. Pt is complaint with his bp medication and has never missed a dose. Reports his leg swelling has improved since last office visit, and occasional snoring while sleeping. Pt hasn't checked his bp recently since he doesn't have a home bp reader. Denies chest pain, chest tightness, SOB, HA, DOE, any sxs with exertion, dizziness, light-headedness, calf pain, fatigue, daytime somnolence, PMHx of sleep apnea, drinking/smoking, or other acute sxs or side effects. Lab Results  Component Value Date   CREATININE 0.93 06/29/2015   BP Readings from Last 3 Encounters:  11/03/16 (!) 166/116  11/09/15 (!) 166/90  08/14/15 132/80   Wt Readings from Last 3 Encounters:  11/03/16 214 lb (97.1 kg)  11/09/15 224 lb 12.8 oz (102 kg)  08/14/15 213 lb 9.6 oz (96.9 kg)   HLD: Takes zocor 20 mg QD. Pt is compliant with his medication. Pt states he's had his cholesterol checked within the past year. Lab Results  Component Value Date   CHOL 153 09/17/2011   HDL 63 09/17/2011   LDLCALC 79 09/17/2011   LDLDIRECT 140.5 06/19/2006   TRIG 57 09/17/2011   CHOLHDL 2.4 09/17/2011   Lab Results  Component Value Date   ALT 30 06/29/2015   AST 23 06/29/2015   ALKPHOS 69 06/29/2015   BILITOT 0.5 06/29/2015   Seasonal Allergies: Has been taking zyrtec QD/PRN and benadryl for relief of his allergies. Pt does not use albuterol.  Patient Active Problem List   Diagnosis Date Noted  . ACUTE BRONCHITIS 08/11/2007  . HYPERLIPIDEMIA 09/27/2006  . ANEMIA-IRON DEFICIENCY 09/27/2006  . HYPERTENSION 09/27/2006  . ALLERGIC RHINITIS 09/27/2006  . DIVERTICULOSIS, COLON 09/27/2006  . COLONIC POLYPS, HX OF 09/27/2006   Past Medical History:  Diagnosis Date  . Hypertension   . Kidney stone    History reviewed. No pertinent surgical history. Allergies  Allergen Reactions  . Lipitor [Atorvastatin Calcium] Other (See Comments)    Muscle pains  . Z-Pak [Azithromycin]   . Erythromycin Rash   Prior to Admission medications   Medication Sig Start Date End Date Taking? Authorizing Provider  albuterol (PROVENTIL HFA;VENTOLIN HFA) 108 (90 BASE) MCG/ACT inhaler Inhale 2 puffs into the lungs every 4 (four) hours as needed for wheezing or shortness of breath (cough, shortness of breath or wheezing.). 06/29/15  Yes Bennett Scrape V, PA-C  cetirizine (ZYRTEC) 10 MG tablet Take 10 mg by mouth daily. Reported on 07/07/2015   Yes Historical Provider, MD  lisinopril (PRINIVIL,ZESTRIL) 40 MG tablet Take 1  tablet (40 mg total) by mouth daily. 11/09/15  Yes Robyn Haber, MD  metoprolol succinate (TOPROL-XL) 50 MG 24 hr tablet Take 1 tablet (50 mg total) by mouth daily. Take with or immediately following a meal. 11/09/15  Yes Robyn Haber, MD  simvastatin (ZOCOR) 20 MG tablet Take 1 tablet (20 mg total) by mouth at bedtime. 11/09/15  Yes Robyn Haber, MD  triamterene-hydrochlorothiazide (MAXZIDE-25) 37.5-25 MG tablet Take 1 tablet by mouth daily. 11/09/15  Yes Robyn Haber, MD   Social History   Social History  .  Marital status: Married    Spouse name: N/A  . Number of children: N/A  . Years of education: N/A   Occupational History  . Not on file.   Social History Main Topics  . Smoking status: Former Research scientist (life sciences)  . Smokeless tobacco: Never Used  . Alcohol use No  . Drug use: No  . Sexual activity: Not on file   Other Topics Concern  . Not on file   Social History Narrative  . No narrative on file   Review of Systems  Constitutional: Negative for fatigue and unexpected weight change.  Eyes: Negative for visual disturbance.  Respiratory: Negative for cough, chest tightness and shortness of breath.   Cardiovascular: Positive for leg swelling. Negative for chest pain and palpitations.  Gastrointestinal: Negative for abdominal pain and blood in stool.  Musculoskeletal: Negative for myalgias.  Allergic/Immunologic: Positive for environmental allergies.  Neurological: Negative for dizziness, light-headedness and headaches.  Psychiatric/Behavioral: Negative for sleep disturbance.   Objective:  Physical Exam  Constitutional: He is oriented to person, place, and time. He appears well-developed and well-nourished.  HENT:  Head: Normocephalic and atraumatic.  Eyes: EOM are normal. Pupils are equal, round, and reactive to light.  Neck: No JVD present. Carotid bruit is not present.  Cardiovascular: Normal rate, regular rhythm and normal heart sounds.   No murmur heard. Pulmonary/Chest: Effort normal and breath sounds normal. He has no rales.  Musculoskeletal: Edema: 2+ edema to the mid to proximal tibia bilaterally.  Neurological: He is alert and oriented to person, place, and time.  Skin: Skin is warm and dry.  Psychiatric: He has a normal mood and affect.  Vitals reviewed.   Vitals:   11/03/16 1140 11/03/16 1150 11/03/16 1210  BP: (!) 239/118 (!) 206/102 (!) 166/116  Pulse: 65    Resp: 16    Temp: 97.5 F (36.4 C)    TempSrc: Oral    SpO2: 99%    Weight: 214 lb (97.1 kg)    Height:  5\' 5"  (1.651 m)    Body mass index is 35.61 kg/m. Assessment & Plan:    Steven ROTHER Sr. is a 66 y.o. male Pedal edema - Plan: Comprehensive metabolic panel, TSH  -Still significant edema off amlodipine. Check CMP, TSH, consider echocardiogram, but lungs clear at this time.  Essential hypertension - Plan: Comprehensive metabolic panel, metoprolol succinate (TOPROL-XL) 25 MG 24 hr tablet, metoprolol succinate (TOPROL-XL) 50 MG 24 hr tablet, triamterene-hydrochlorothiazide (MAXZIDE-25) 37.5-25 MG tablet, lisinopril (PRINIVIL,ZESTRIL) 40 MG tablet  -Still uncontrolled. Based on current doses, will increasing Toprol slightly to 75 mg total per day. Orthostatic/bradycardia precautions/risks discussed. Continue lisinopril and Maxzide at same doses for now. Recheck 2 weeks  Hyperlipidemia, unspecified hyperlipidemia type - Plan: Lipid panel, simvastatin (ZOCOR) 20 MG tablet  -Tolerating Zocor, check lipid panel, CMP.  Meds ordered this encounter  Medications  . metoprolol succinate (TOPROL-XL) 25 MG 24 hr tablet    Sig: Take  1 tablet (25 mg total) by mouth daily.    Dispense:  90 tablet    Refill:  1  . metoprolol succinate (TOPROL-XL) 50 MG 24 hr tablet    Sig: Take 1 tablet (50 mg total) by mouth daily. Take with or immediately following a meal.    Dispense:  90 tablet    Refill:  1  . triamterene-hydrochlorothiazide (MAXZIDE-25) 37.5-25 MG tablet    Sig: Take 1 tablet by mouth daily.    Dispense:  90 tablet    Refill:  1  . simvastatin (ZOCOR) 20 MG tablet    Sig: Take 1 tablet (20 mg total) by mouth at bedtime.    Dispense:  90 tablet    Refill:  1  . lisinopril (PRINIVIL,ZESTRIL) 40 MG tablet    Sig: Take 1 tablet (40 mg total) by mouth daily.    Dispense:  90 tablet    Refill:  1   Patient Instructions   Your blood pressure is too high. Although we did get it to come down a little bit in the office, it is still running higher than it should be. Continue triamterene/HCTZ once  per day, lisinopril once per day, Toprol 50 mg once per day, and take an additional Toprol 25 mg pill once per day.   Watch for lightheadedness or dizziness with this medication, and if you do experience those symptoms, return right away. Monitor your blood pressures outside of the office and keep a record for your next visit. Follow-up with me within 2 weeks. I will also check electrolytes, thyroid tests, and cholesterol test. Continue same cholesterol medicine for now.  Return to the clinic or go to the nearest emergency room if any of your symptoms worsen or new symptoms occur.   Hypertension Hypertension, commonly called high blood pressure, is when the force of blood pumping through the arteries is too strong. The arteries are the blood vessels that carry blood from the heart throughout the body. Hypertension forces the heart to work harder to pump blood and may cause arteries to become narrow or stiff. Having untreated or uncontrolled hypertension can cause heart attacks, strokes, kidney disease, and other problems. A blood pressure reading consists of a higher number over a lower number. Ideally, your blood pressure should be below 120/80. The first ("top") number is called the systolic pressure. It is a measure of the pressure in your arteries as your heart beats. The second ("bottom") number is called the diastolic pressure. It is a measure of the pressure in your arteries as the heart relaxes. What are the causes? The cause of this condition is not known. What increases the risk? Some risk factors for high blood pressure are under your control. Others are not. Factors you can change   Smoking.  Having type 2 diabetes mellitus, high cholesterol, or both.  Not getting enough exercise or physical activity.  Being overweight.  Having too much fat, sugar, calories, or salt (sodium) in your diet.  Drinking too much alcohol. Factors that are difficult or impossible to change   Having  chronic kidney disease.  Having a family history of high blood pressure.  Age. Risk increases with age.  Race. You may be at higher risk if you are African-American.  Gender. Men are at higher risk than women before age 33. After age 48, women are at higher risk than men.  Having obstructive sleep apnea.  Stress. What are the signs or symptoms? Extremely high blood pressure (hypertensive crisis)  may cause:  Headache.  Anxiety.  Shortness of breath.  Nosebleed.  Nausea and vomiting.  Severe chest pain.  Jerky movements you cannot control (seizures). How is this diagnosed? This condition is diagnosed by measuring your blood pressure while you are seated, with your arm resting on a surface. The cuff of the blood pressure monitor will be placed directly against the skin of your upper arm at the level of your heart. It should be measured at least twice using the same arm. Certain conditions can cause a difference in blood pressure between your right and left arms. Certain factors can cause blood pressure readings to be lower or higher than normal (elevated) for a short period of time:  When your blood pressure is higher when you are in a health care provider's office than when you are at home, this is called white coat hypertension. Most people with this condition do not need medicines.  When your blood pressure is higher at home than when you are in a health care provider's office, this is called masked hypertension. Most people with this condition may need medicines to control blood pressure. If you have a high blood pressure reading during one visit or you have normal blood pressure with other risk factors:  You may be asked to return on a different day to have your blood pressure checked again.  You may be asked to monitor your blood pressure at home for 1 week or longer. If you are diagnosed with hypertension, you may have other blood or imaging tests to help your health care  provider understand your overall risk for other conditions. How is this treated? This condition is treated by making healthy lifestyle changes, such as eating healthy foods, exercising more, and reducing your alcohol intake. Your health care provider may prescribe medicine if lifestyle changes are not enough to get your blood pressure under control, and if:  Your systolic blood pressure is above 130.  Your diastolic blood pressure is above 80. Your personal target blood pressure may vary depending on your medical conditions, your age, and other factors. Follow these instructions at home: Eating and drinking   Eat a diet that is high in fiber and potassium, and low in sodium, added sugar, and fat. An example eating plan is called the DASH (Dietary Approaches to Stop Hypertension) diet. To eat this way:  Eat plenty of fresh fruits and vegetables. Try to fill half of your plate at each meal with fruits and vegetables.  Eat whole grains, such as whole wheat pasta, brown rice, or whole grain bread. Fill about one quarter of your plate with whole grains.  Eat or drink low-fat dairy products, such as skim milk or low-fat yogurt.  Avoid fatty cuts of meat, processed or cured meats, and poultry with skin. Fill about one quarter of your plate with lean proteins, such as fish, chicken without skin, beans, eggs, and tofu.  Avoid premade and processed foods. These tend to be higher in sodium, added sugar, and fat.  Reduce your daily sodium intake. Most people with hypertension should eat less than 1,500 mg of sodium a day.  Limit alcohol intake to no more than 1 drink a day for nonpregnant women and 2 drinks a day for men. One drink equals 12 oz of beer, 5 oz of wine, or 1 oz of hard liquor. Lifestyle   Work with your health care provider to maintain a healthy body weight or to lose weight. Ask what an ideal weight  is for you.  Get at least 30 minutes of exercise that causes your heart to beat  faster (aerobic exercise) most days of the week. Activities may include walking, swimming, or biking.  Include exercise to strengthen your muscles (resistance exercise), such as pilates or lifting weights, as part of your weekly exercise routine. Try to do these types of exercises for 30 minutes at least 3 days a week.  Do not use any products that contain nicotine or tobacco, such as cigarettes and e-cigarettes. If you need help quitting, ask your health care provider.  Monitor your blood pressure at home as told by your health care provider.  Keep all follow-up visits as told by your health care provider. This is important. Medicines   Take over-the-counter and prescription medicines only as told by your health care provider. Follow directions carefully. Blood pressure medicines must be taken as prescribed.  Do not skip doses of blood pressure medicine. Doing this puts you at risk for problems and can make the medicine less effective.  Ask your health care provider about side effects or reactions to medicines that you should watch for. Contact a health care provider if:  You think you are having a reaction to a medicine you are taking.  You have headaches that keep coming back (recurring).  You feel dizzy.  You have swelling in your ankles.  You have trouble with your vision. Get help right away if:  You develop a severe headache or confusion.  You have unusual weakness or numbness.  You feel faint.  You have severe pain in your chest or abdomen.  You vomit repeatedly.  You have trouble breathing. Summary  Hypertension is when the force of blood pumping through your arteries is too strong. If this condition is not controlled, it may put you at risk for serious complications.  Your personal target blood pressure may vary depending on your medical conditions, your age, and other factors. For most people, a normal blood pressure is less than 120/80.  Hypertension is  treated with lifestyle changes, medicines, or a combination of both. Lifestyle changes include weight loss, eating a healthy, low-sodium diet, exercising more, and limiting alcohol. This information is not intended to replace advice given to you by your health care provider. Make sure you discuss any questions you have with your health care provider. Document Released: 06/20/2005 Document Revised: 05/18/2016 Document Reviewed: 05/18/2016 Elsevier Interactive Patient Education  2017 [employer].     IF you received an x-ray today, you will receive an invoice from [facility] Radiology. Please contact [facility] at [phone] with questions or concerns regarding your invoice.   IF you received labwork today, you will receive an invoice from [place]. Please contact [facility] at [phone] with questions or concerns regarding your invoice.   Our billing staff will not be able to assist you with questions regarding bills from these companies.  You will be contacted with the lab results as soon as they are available. The fastest way to get your results is to activate your My Chart account. Instructions are located on the last page of this paperwork. If you have not heard from [place] regarding the results in 2 weeks, please contact this office.       I personally performed the services described in this documentation, which was scribed in my presence. The recorded information has been reviewed and considered for accuracy and completeness, addended by me as needed, and agree with information above.  Signed,   Dellis Filbert  Carlota Raspberry, MD [facility].  11/06/16 10:27 AM

## 2016-11-03 NOTE — Telephone Encounter (Signed)
Called pharmacist Abigail Butts) to advise keep the Metoprolol 25mg  and Metoprolol 50 mg as escribed, per Dr Carlota Raspberry.

## 2016-11-04 ENCOUNTER — Telehealth: Payer: Self-pay | Admitting: *Deleted

## 2016-11-04 NOTE — Telephone Encounter (Signed)
Called patient left message in voice mail at home number to call back to PCP, ask for Caren Griffins.

## 2016-11-05 ENCOUNTER — Other Ambulatory Visit: Payer: Self-pay | Admitting: Family Medicine

## 2016-11-05 DIAGNOSIS — I10 Essential (primary) hypertension: Secondary | ICD-10-CM

## 2016-11-07 ENCOUNTER — Other Ambulatory Visit: Payer: Self-pay | Admitting: *Deleted

## 2016-11-07 DIAGNOSIS — I10 Essential (primary) hypertension: Secondary | ICD-10-CM

## 2016-11-07 DIAGNOSIS — E785 Hyperlipidemia, unspecified: Secondary | ICD-10-CM

## 2016-11-07 DIAGNOSIS — R600 Localized edema: Secondary | ICD-10-CM

## 2016-12-23 NOTE — Telephone Encounter (Signed)
Entry error

## 2017-05-02 ENCOUNTER — Ambulatory Visit (INDEPENDENT_AMBULATORY_CARE_PROVIDER_SITE_OTHER): Payer: Medicare Other | Admitting: Family Medicine

## 2017-05-02 ENCOUNTER — Encounter: Payer: Self-pay | Admitting: Family Medicine

## 2017-05-02 VITALS — BP 152/90 | HR 76 | Temp 98.9°F | Resp 16 | Ht 65.75 in | Wt 222.0 lb

## 2017-05-02 DIAGNOSIS — R1900 Intra-abdominal and pelvic swelling, mass and lump, unspecified site: Secondary | ICD-10-CM

## 2017-05-02 DIAGNOSIS — R7989 Other specified abnormal findings of blood chemistry: Secondary | ICD-10-CM | POA: Diagnosis not present

## 2017-05-02 DIAGNOSIS — Z23 Encounter for immunization: Secondary | ICD-10-CM | POA: Diagnosis not present

## 2017-05-02 DIAGNOSIS — I10 Essential (primary) hypertension: Secondary | ICD-10-CM | POA: Diagnosis not present

## 2017-05-02 DIAGNOSIS — R600 Localized edema: Secondary | ICD-10-CM | POA: Diagnosis not present

## 2017-05-02 DIAGNOSIS — M25569 Pain in unspecified knee: Secondary | ICD-10-CM | POA: Diagnosis not present

## 2017-05-02 DIAGNOSIS — R0601 Orthopnea: Secondary | ICD-10-CM | POA: Diagnosis not present

## 2017-05-02 DIAGNOSIS — E785 Hyperlipidemia, unspecified: Secondary | ICD-10-CM | POA: Diagnosis not present

## 2017-05-02 DIAGNOSIS — R12 Heartburn: Secondary | ICD-10-CM

## 2017-05-02 MED ORDER — METOPROLOL SUCCINATE ER 25 MG PO TB24: 25.0000 mg | ORAL_TABLET | Freq: Every day | ORAL | 1 refills | Status: DC

## 2017-05-02 MED ORDER — SIMVASTATIN 20 MG PO TABS: 20.0000 mg | ORAL_TABLET | Freq: Every day | ORAL | 1 refills | Status: DC

## 2017-05-02 MED ORDER — METOPROLOL SUCCINATE ER 50 MG PO TB24
50.0000 mg | ORAL_TABLET | Freq: Every day | ORAL | 1 refills | Status: DC
Start: 2017-05-02 — End: 2017-09-25

## 2017-05-02 MED ORDER — LISINOPRIL 40 MG PO TABS: ORAL_TABLET | ORAL | 1 refills | Status: DC

## 2017-05-02 MED ORDER — TRIAMTERENE-HCTZ 37.5-25 MG PO TABS: 1.0000 | ORAL_TABLET | Freq: Every day | ORAL | 1 refills | Status: DC

## 2017-05-02 NOTE — Patient Instructions (Addendum)
No change in meds for now, but follow up with me next week to follow up on labs from today and results of heart echo and abdomen ultrasound. We will call you to schedule those tests.   If any fever, abdominal pain or worse symptoms - return sooner.    Peripheral Edema Peripheral edema is swelling that is caused by a buildup of fluid. Peripheral edema most often affects the lower legs, ankles, and feet. It can also develop in the arms, hands, and face. The area of the body that has peripheral edema will look swollen. It may also feel heavy or warm. Your clothes may start to feel tight. Pressing on the area may make a temporary dent in your skin. You may not be able to move your arm or leg as much as usual. There are many causes of peripheral edema. It can be a complication of other diseases, such as congestive heart failure, kidney disease, or a problem with your blood circulation. It also can be a side effect of certain medicines. It often happens to women during pregnancy. Sometimes, the cause is not known. Treating the underlying condition is often the only treatment for peripheral edema. Follow these instructions at home: Pay attention to any changes in your symptoms. Take these actions to help with your discomfort:  Raise (elevate) your legs while you are sitting or lying down.  Move around often to prevent stiffness and to lessen swelling. Do not sit or stand for long periods of time.  Wear support stockings as told by your health care provider.  Follow instructions from your health care provider about limiting salt (sodium) in your diet. Sometimes eating less salt can reduce swelling.  Take over-the-counter and prescription medicines only as told by your health care provider. Your health care provider may prescribe medicine to help your body get rid of excess water (diuretic).  Keep all follow-up visits as told by your health care provider. This is important.  Contact a health care  provider if:  You have a fever.  Your edema starts suddenly or is getting worse, especially if you are pregnant or have a medical condition.  You have swelling in only one leg.  You have increased swelling and pain in your legs. Get help right away if:  You develop shortness of breath, especially when you are lying down.  You have pain in your chest or abdomen.  You feel weak.  You faint. This information is not intended to replace advice given to you by your health care provider. Make sure you discuss any questions you have with your health care provider. Document Released: 07/28/2004 Document Revised: 11/23/2015 Document Reviewed: 12/31/2014 Elsevier Interactive Patient Education  2018 [employer].    IF you received an x-ray today, you will receive an invoice from [facility]. Please contact [facility]iology at [phone] with questions or concerns regarding your invoice.   IF you received labwork today, you will receive an invoice from [place]. Please contact [facility] at [phone] with questions or concerns regarding your invoice.   Our billing staff will not be able to assist you with questions regarding bills from these companies.  You will be contacted with the lab results as soon as they are available. The fastest way to get your results is to activate your My Chart account. Instructions are located on the last page of this paperwork. If you have not heard from [place] regarding the results in 2 weeks, please contact this office.

## 2017-05-02 NOTE — Progress Notes (Addendum)
Subjective:  By signing my name below, I, Steven Benjamin, attest that this documentation has been prepared under the direction and in the presence of Steven Agreste, MD Electronically Signed: Ladene Benjamin, ED Scribe 05/02/2017 at 5:18 PM.   Patient ID: Steven Polio Sr., male    DOB: 1951/02/16, 66 y.o.   MRN: [id number]  Chief Complaint  Patient presents with  . Hypertension    follow-up  . Hyperlipidemia   HPI [name] is a 66 y.o. male who presents to Primary Care at [facility] for follow-up.   HTN Lab Results  Component Value Date   CREATININE 0.93 06/29/2015  Lisinopril 40 mg qd, Maxzide 25 mg qd, Toprol which was increased to total 75 mg qd in May. BP uncontrolled at 166/116 at that visit after initial reading of 239/118. Recommended 2 week f/u with home readings. Have not seen him since that visit. Pt checks his BP outside of the office with average readings of 140/80. Also reports being compliant with all of his medications. States he used to only need Lisinopril for BP for several years. Denies cp but reports h/o heartburn after eating which resolves after taking antiacid pill. Also has heartburn if he forgets to take antiacid. Pt has also noticed some swelling to his abdomen for the past 6 months-1 year, worsened over the past 2 weeks. Denies sob, cough, light-headedness, dizziness, blood in stools. Reports h/o 8 kidney stones.   Pedal Edema See last OV. Previously thought to be due to amlodipine which was discontinued. Some leg swelling improvement at May visit but still had 2+ edema to mid/proximal tibia bilaterally. Planned for lab work last visit but that has not yet been drawn. Pt states that 4 different people in the office tried to draw his blood and were unsuccessful so he left. Today, he reports that leg swelling is unchanged from last visit. Initially noticed LE swelling 1-2 years ago. Leg swelling is worsened with prolonged standing, improved with taking  Maxzide.  Wt Readings from Last 3 Encounters:  05/02/17 222 lb (100.7 kg)  11/03/16 214 lb (97.1 kg)  11/09/15 224 lb 12.8 oz (102 kg)   Hyperlipidemia Takes Zocor 20 mg qd. Denies myalgias, side effects.   Patient Active Problem List   Diagnosis Date Noted  . ACUTE BRONCHITIS 08/11/2007  . HYPERLIPIDEMIA 09/27/2006  . ANEMIA-IRON DEFICIENCY 09/27/2006  . HYPERTENSION 09/27/2006  . ALLERGIC RHINITIS 09/27/2006  . DIVERTICULOSIS, COLON 09/27/2006  . COLONIC POLYPS, HX OF 09/27/2006   Past Medical History:  Diagnosis Date  . Hypertension   . Kidney stone    History reviewed. No pertinent surgical history. Allergies  Allergen Reactions  . Lipitor [Atorvastatin Calcium] Other (See Comments)    Muscle pains  . Z-Pak [Azithromycin]   . Erythromycin Rash   Prior to Admission medications   Medication Sig Start Date End Date Taking? Authorizing Provider  cetirizine (ZYRTEC) 10 MG tablet Take 10 mg by mouth daily. Reported on 07/07/2015   Yes [provider]  lisinopril (PRINIVIL,ZESTRIL) 40 MG tablet TAKE 1 TABLET(40 MG) BY MOUTH DAILY 11/05/16  Yes Steven Agreste, MD  metoprolol succinate (TOPROL-XL) 25 MG 24 hr tablet Take 1 tablet (25 mg total) by mouth daily. 11/03/16  Yes Steven Agreste, MD  metoprolol succinate (TOPROL-XL) 50 MG 24 hr tablet Take 1 tablet (50 mg total) by mouth daily. Take with or immediately following a meal. 11/03/16  Yes Steven Agreste, MD  simvastatin (ZOCOR) 20  MG tablet Take 1 tablet (20 mg total) by mouth at bedtime. 11/03/16  Yes Steven Agreste, MD  triamterene-hydrochlorothiazide (MAXZIDE-25) 37.5-25 MG tablet Take 1 tablet by mouth daily. 11/03/16  Yes Steven Agreste, MD   Social History   Social History  . Marital status: Married    Spouse name: N/A  . Number of children: N/A  . Years of education: N/A   Occupational History  . Not on file.   Social History Main Topics  . Smoking status: Former Research scientist (life sciences)  . Smokeless tobacco:  Never Used  . Alcohol use No  . Drug use: No  . Sexual activity: Not on file   Other Topics Concern  . Not on file   Social History Narrative  . No narrative on file   Review of Systems  Constitutional: Negative for fatigue and unexpected weight change.  Eyes: Negative for visual disturbance.  Respiratory: Negative for cough, chest tightness and shortness of breath.   Cardiovascular: Positive for leg swelling. Negative for chest pain and palpitations.  Gastrointestinal: Positive for abdominal distention. Negative for abdominal pain and blood in stool.  Musculoskeletal: Negative for myalgias.  Neurological: Negative for dizziness, light-headedness and headaches.      Objective:   Physical Exam  Constitutional: He is oriented to person, place, and time. He appears well-developed and well-nourished.  HENT:  Head: Normocephalic and atraumatic.  Eyes: Pupils are equal, round, and reactive to light. EOM are normal.  Neck: Carotid bruit is not present.  No apparent JVD.   Cardiovascular: Normal rate, regular rhythm and normal heart sounds.   No murmur heard. Pulmonary/Chest: Effort normal and breath sounds normal. He has no rales.  Lungs are clear to auscultation.  Abdominal: There is no tenderness. A hernia (Easily reducible umbilical) is present.  Positive fluid wave. nontender.  Musculoskeletal: He exhibits no edema.  Pitting edema that extends to the proximal tibia. 2-3+ at the ankles without any skin changes, bilateral lower legs.   Neurological: He is alert and oriented to person, place, and time.  Skin: Skin is warm and dry.  Psychiatric: He has a normal mood and affect.  Vitals reviewed.   Vitals:   05/02/17 1657  BP: (!) 152/90  Pulse: 76  Resp: 16  Temp: 98.9 F (37.2 C)  TempSrc: Oral  SpO2: 98%  Weight: 222 lb (100.7 kg)  Height: 5' 5.75" (1.67 m)      Assessment & Plan:    Steven SPRINGS Sr. is a 66 y.o. male Essential hypertension - Plan: Comprehensive  metabolic panel, lisinopril (PRINIVIL,ZESTRIL) 40 MG tablet, metoprolol succinate (TOPROL-XL) 25 MG 24 hr tablet, metoprolol succinate (TOPROL-XL) 50 MG 24 hr tablet, triamterene-hydrochlorothiazide (MAXZIDE-25) 37.5-25 MG tablet Pedal edema - Plan: Comprehensive metabolic panel, TSH, ECHOCARDIOGRAM COMPLETE Abdominal swelling - Plan: CBC, Comprehensive metabolic panel, US Abdomen Complete  - Improved control of BP, still not quite at goal. We'll continue same dose of medications for now.   -Persistent pedal edema concerning, unfortunately was not able to obtain blood work last visit, agrees to repeat blood work today.  -Check CMP, TSH, CBC for peripheral edema  -Check echo to rule out CHF/check EF.  -US abdomen for possible ascites. May need to progress to CT depending on findings.   - recheck in 1 week to review labs, and decide next step.   -consider lasix depending on renal fxn/lytes.    Need for prophylactic vaccination and inoculation against influenza - Plan: Flu Vaccine QUAD 36+ mos IM  Heartburn  - Episodic. Plan to discuss further next visit. Denies dark/tarry stools, or vomiting. Abdomen nontender  Hyperlipidemia, unspecified hyperlipidemia type - Plan: simvastatin (ZOCOR) 20 MG tablet  -Tolerating Zocor, continue same dose for now.  Meds ordered this encounter  Medications  . lisinopril (PRINIVIL,ZESTRIL) 40 MG tablet    Sig: TAKE 1 TABLET(40 MG) BY MOUTH DAILY    Dispense:  90 tablet    Refill:  1  . metoprolol succinate (TOPROL-XL) 25 MG 24 hr tablet    Sig: Take 1 tablet (25 mg total) by mouth daily.    Dispense:  90 tablet    Refill:  1  . metoprolol succinate (TOPROL-XL) 50 MG 24 hr tablet    Sig: Take 1 tablet (50 mg total) by mouth daily. Take with or immediately following a meal.    Dispense:  90 tablet    Refill:  1  . simvastatin (ZOCOR) 20 MG tablet    Sig: Take 1 tablet (20 mg total) by mouth at bedtime.    Dispense:  90 tablet    Refill:  1  .  triamterene-hydrochlorothiazide (MAXZIDE-25) 37.5-25 MG tablet    Sig: Take 1 tablet by mouth daily.    Dispense:  90 tablet    Refill:  1   Patient Instructions   No change in meds for now, but follow up with me next week to follow up on labs from today and results of heart echo and abdomen ultrasound. We will call you to schedule those tests.   If any fever, abdominal pain or worse symptoms - return sooner.    Peripheral Edema Peripheral edema is swelling that is caused by a buildup of fluid. Peripheral edema most often affects the lower legs, ankles, and feet. It can also develop in the arms, hands, and face. The area of the body that has peripheral edema will look swollen. It may also feel heavy or warm. Your clothes may start to feel tight. Pressing on the area may make a temporary dent in your skin. You may not be able to move your arm or leg as much as usual. There are many causes of peripheral edema. It can be a complication of other diseases, such as congestive heart failure, kidney disease, or a problem with your blood circulation. It also can be a side effect of certain medicines. It often happens to women during pregnancy. Sometimes, the cause is not known. Treating the underlying condition is often the only treatment for peripheral edema. Follow these instructions at home: Pay attention to any changes in your symptoms. Take these actions to help with your discomfort:  Raise (elevate) your legs while you are sitting or lying down.  Move around often to prevent stiffness and to lessen swelling. Do not sit or stand for long periods of time.  Wear support stockings as told by your health care provider.  Follow instructions from your health care provider about limiting salt (sodium) in your diet. Sometimes eating less salt can reduce swelling.  Take over-the-counter and prescription medicines only as told by your health care provider. Your health care provider may prescribe medicine  to help your body get rid of excess water (diuretic).  Keep all follow-up visits as told by your health care provider. This is important.  Contact a health care provider if:  You have a fever.  Your edema starts suddenly or is getting worse, especially if you are pregnant or have a medical condition.  You have swelling in only  one leg.  You have increased swelling and pain in your legs. Get help right away if:  You develop shortness of breath, especially when you are lying down.  You have pain in your chest or abdomen.  You feel weak.  You faint. This information is not intended to replace advice given to you by your health care provider. Make sure you discuss any questions you have with your health care provider. Document Released: 07/28/2004 Document Revised: 11/23/2015 Document Reviewed: 12/31/2014 Elsevier Interactive Patient Education  2018 [employer].    IF you received an x-ray today, you will receive an invoice from [facility]. Please contact [facility] at [phone] with questions or concerns regarding your invoice.   IF you received labwork today, you will receive an invoice from [facility]. Please contact [facility] at [phone] with questions or concerns regarding your invoice.   Our billing staff will not be able to assist you with questions regarding bills from these companies.  You will be contacted with the lab results as soon as they are available. The fastest way to get your results is to activate your My Chart account. Instructions are located on the last page of this paperwork. If you have not heard from [place] regarding the results in 2 weeks, please contact this office.         I personally performed the services described in this documentation, which was scribed in my presence. The recorded information has been reviewed and considered for accuracy and completeness, addended by me as needed, and agree with information  above.  Signed,   [name], MD Primary [facility] at [facility].  05/02/17 6:13 PM

## 2017-05-03 LAB — COMPREHENSIVE METABOLIC PANEL
A/G RATIO: 1.3 (ref 1.2–2.2)
ALT: 19 IU/L (ref 0–44)
AST: 22 IU/L (ref 0–40)
Albumin: 4.3 g/dL (ref 3.6–4.8)
Alkaline Phosphatase: 77 IU/L (ref 39–117)
BUN/Creatinine Ratio: 19 (ref 10–24)
BUN: 20 mg/dL (ref 8–27)
Bilirubin Total: 0.3 mg/dL (ref 0.0–1.2)
CALCIUM: 9.4 mg/dL (ref 8.6–10.2)
CO2: 26 mmol/L (ref 20–29)
Chloride: 98 mmol/L (ref 96–106)
Creatinine, Ser: 1.05 mg/dL (ref 0.76–1.27)
GFR, EST AFRICAN AMERICAN: 84 mL/min/{1.73_m2} (ref >59)
GFR, EST NON AFRICAN AMERICAN: 73 mL/min/{1.73_m2} (ref >59)
GLOBULIN, TOTAL: 3.2 g/dL (ref 1.5–4.5)
Glucose: 166 mg/dL — ABNORMAL HIGH (ref 65–99)
POTASSIUM: 4.7 mmol/L (ref 3.5–5.2)
Sodium: 139 mmol/L (ref 134–144)
TOTAL PROTEIN: 7.5 g/dL (ref 6.0–8.5)

## 2017-05-03 LAB — CBC
HEMATOCRIT: 41.7 % (ref 37.5–51.0)
HEMOGLOBIN: 13.6 g/dL (ref 13.0–17.7)
MCH: 29.1 pg (ref 26.6–33.0)
MCHC: 32.6 g/dL (ref 31.5–35.7)
MCV: 89 fL (ref 79–97)
PLATELETS: 317 10*3/uL (ref 150–379)
RBC: 4.67 x10E6/uL (ref 4.14–5.80)
RDW: 13.7 % (ref 12.3–15.4)
WBC: 12.3 10*3/uL — AB (ref 3.4–10.8)

## 2017-05-03 LAB — TSH: TSH: 7.78 u[IU]/mL — ABNORMAL HIGH (ref 0.450–4.500)

## 2017-05-04 ENCOUNTER — Ambulatory Visit (INDEPENDENT_AMBULATORY_CARE_PROVIDER_SITE_OTHER): Payer: Medicare Other | Admitting: Physician Assistant

## 2017-05-04 ENCOUNTER — Ambulatory Visit (INDEPENDENT_AMBULATORY_CARE_PROVIDER_SITE_OTHER): Payer: Medicare Other

## 2017-05-04 ENCOUNTER — Telehealth: Payer: Self-pay | Admitting: Physician Assistant

## 2017-05-04 VITALS — BP 182/82 | HR 75 | Temp 97.8°F | Resp 16 | Ht 65.0 in | Wt 222.0 lb

## 2017-05-04 DIAGNOSIS — R600 Localized edema: Secondary | ICD-10-CM

## 2017-05-04 DIAGNOSIS — R0601 Orthopnea: Secondary | ICD-10-CM

## 2017-05-04 DIAGNOSIS — N200 Calculus of kidney: Secondary | ICD-10-CM | POA: Diagnosis not present

## 2017-05-04 DIAGNOSIS — K4090 Unilateral inguinal hernia, without obstruction or gangrene, not specified as recurrent: Secondary | ICD-10-CM | POA: Diagnosis not present

## 2017-05-04 DIAGNOSIS — I10 Essential (primary) hypertension: Secondary | ICD-10-CM | POA: Diagnosis not present

## 2017-05-04 DIAGNOSIS — R7989 Other specified abnormal findings of blood chemistry: Secondary | ICD-10-CM

## 2017-05-04 MED ORDER — FUROSEMIDE 20 MG PO TABS: 20.0000 mg | ORAL_TABLET | Freq: Every day | ORAL | 1 refills | Status: DC

## 2017-05-04 MED ORDER — POTASSIUM CHLORIDE ER 10 MEQ PO TBCR: 10.0000 meq | EXTENDED_RELEASE_TABLET | Freq: Every day | ORAL | 1 refills | Status: DC

## 2017-05-04 NOTE — Telephone Encounter (Signed)
Please add.  Elevated tsh--t4, t3 Pro bnp--orthopnea

## 2017-05-04 NOTE — Patient Instructions (Addendum)
Please start the furosemide.  I want you to follow up with Dr. Carlota Raspberry. I would like you to follow up in 48 hours for recheck of your blood pressure. Please watch what you are eating.  Do not eat any canned foods, or high salt intake  DASH Eating Plan DASH stands for "Dietary Approaches to Stop Hypertension." The DASH eating plan is a healthy eating plan that has been shown to reduce high blood pressure (hypertension). It may also reduce your risk for type 2 diabetes, heart disease, and stroke. The DASH eating plan may also help with weight loss. What are tips for following this plan? General guidelines  Avoid eating more than 2,300 mg (milligrams) of salt (sodium) a day. If you have hypertension, you may need to reduce your sodium intake to 1,500 mg a day.  Limit alcohol intake to no more than 1 drink a day for nonpregnant women and 2 drinks a day for men. One drink equals 12 oz of beer, 5 oz of wine, or 1 oz of hard liquor.  Work with your health care provider to maintain a healthy body weight or to lose weight. Ask what an ideal weight is for you.  Get at least 30 minutes of exercise that causes your heart to beat faster (aerobic exercise) most days of the week. Activities may include walking, swimming, or biking.  Work with your health care provider or diet and nutrition specialist (dietitian) to adjust your eating plan to your individual calorie needs. Reading food labels  Check food labels for the amount of sodium per serving. Choose foods with less than 5 percent of the Daily Value of sodium. Generally, foods with less than 300 mg of sodium per serving fit into this eating plan.  To find whole grains, look for the word "whole" as the first word in the ingredient list. Shopping  Buy products labeled as "low-sodium" or "no salt added."  Buy fresh foods. Avoid canned foods and premade or frozen meals. Cooking  Avoid adding salt when cooking. Use salt-free seasonings or herbs instead  of table salt or sea salt. Check with your health care provider or pharmacist before using salt substitutes.  Do not fry foods. Cook foods using healthy methods such as baking, boiling, grilling, and broiling instead.  Cook with heart-healthy oils, such as olive, canola, soybean, or sunflower oil. Meal planning   Eat a balanced diet that includes: ? 5 or more servings of fruits and vegetables each day. At each meal, try to fill half of your plate with fruits and vegetables. ? Up to 6-8 servings of whole grains each day. ? Less than 6 oz of lean meat, poultry, or fish each day. A 3-oz serving of meat is about the same size as a deck of cards. One egg equals 1 oz. ? 2 servings of low-fat dairy each day. ? A serving of nuts, seeds, or beans 5 times each week. ? Heart-healthy fats. Healthy fats called Omega-3 fatty acids are found in foods such as flaxseeds and coldwater fish, like sardines, salmon, and mackerel.  Limit how much you eat of the following: ? Canned or prepackaged foods. ? Food that is high in trans fat, such as fried foods. ? Food that is high in saturated fat, such as fatty meat. ? Sweets, desserts, sugary drinks, and other foods with added sugar. ? Full-fat dairy products.  Do not salt foods before eating.  Try to eat at least 2 vegetarian meals each week.  Eat more  food and less restaurant, buffet, and fast food.  When eating at a restaurant, ask that your food be prepared with less salt or no salt, if possible. What foods are recommended? The items listed may not be a complete list. Talk with your dietitian about what dietary choices are best for you. Grains Whole-grain or whole-wheat bread. Whole-grain or whole-wheat pasta. Brown rice. Oatmeal. Quinoa. Bulgur. Whole-grain and low-sodium cereals. Pita bread. Low-fat, low-sodium crackers. Whole-wheat flour tortillas. Vegetables Fresh or frozen vegetables (raw, steamed, roasted, or grilled). Low-sodium or  reduced-sodium tomato and vegetable juice. Low-sodium or reduced-sodium tomato sauce and tomato paste. Low-sodium or reduced-sodium canned vegetables. Fruits All fresh, dried, or frozen fruit. Canned fruit in natural juice (without added sugar). Meat and other protein foods Skinless chicken or turkey. Ground chicken or turkey. Pork with fat trimmed off. Fish and seafood. Egg whites. Dried beans, peas, or lentils. Unsalted nuts, nut butters, and seeds. Unsalted canned beans. Lean cuts of beef with fat trimmed off. Low-sodium, lean deli meat. Dairy Low-fat (1%) or fat-free (skim) milk. Fat-free, low-fat, or reduced-fat cheeses. Nonfat, low-sodium ricotta or cottage cheese. Low-fat or nonfat yogurt. Low-fat, low-sodium cheese. Fats and oils Soft margarine without trans fats. Vegetable oil. Low-fat, reduced-fat, or light mayonnaise and salad dressings (reduced-sodium). Canola, safflower, olive, soybean, and sunflower oils. Avocado. Seasoning and other foods Herbs. Spices. Seasoning mixes without salt. Unsalted popcorn and pretzels. Fat-free sweets. What foods are not recommended? The items listed may not be a complete list. Talk with your dietitian about what dietary choices are best for you. Grains Baked goods made with fat, such as croissants, muffins, or some breads. Dry pasta or rice meal packs. Vegetables Creamed or fried vegetables. Vegetables in a cheese sauce. Regular canned vegetables (not low-sodium or reduced-sodium). Regular canned tomato sauce and paste (not low-sodium or reduced-sodium). Regular tomato and vegetable juice (not low-sodium or reduced-sodium). Pickles. Olives. Fruits Canned fruit in a light or heavy syrup. Fried fruit. Fruit in cream or butter sauce. Meat and other protein foods Fatty cuts of meat. Ribs. Fried meat. Bacon. Sausage. Bologna and other processed lunch meats. Salami. Fatback. Hotdogs. Bratwurst. Salted nuts and seeds. Canned beans with added salt. Canned or  smoked fish. Whole eggs or egg yolks. Chicken or turkey with skin. Dairy Whole or 2% milk, cream, and half-and-half. Whole or full-fat cream cheese. Whole-fat or sweetened yogurt. Full-fat cheese. Nondairy creamers. Whipped toppings. Processed cheese and cheese spreads. Fats and oils Butter. Stick margarine. Lard. Shortening. Ghee. Bacon fat. Tropical oils, such as coconut, palm kernel, or palm oil. Seasoning and other foods Salted popcorn and pretzels. Onion salt, garlic salt, seasoned salt, table salt, and sea salt. Worcestershire sauce. Tartar sauce. Barbecue sauce. Teriyaki sauce. Soy sauce, including reduced-sodium. Steak sauce. Canned and packaged gravies. Fish sauce. Oyster sauce. Cocktail sauce. Horseradish that you find on the shelf. Ketchup. Mustard. Meat flavorings and tenderizers. Bouillon cubes. Hot sauce and Tabasco sauce. Premade or packaged marinades. Premade or packaged taco seasonings. Relishes. Regular salad dressings. Where to find more information:  National Heart, Lung, and Blood Institute: www.nhlbi.nih.gov  American Heart Association: www.heart.org Summary  The DASH eating plan is a healthy eating plan that has been shown to reduce high blood pressure (hypertension). It may also reduce your risk for type 2 diabetes, heart disease, and stroke.  With the DASH eating plan, you should limit salt (sodium) intake to 2,300 mg a day. If you have hypertension, you may need to reduce your sodium intake to 1,500 mg   mg a day.  When on the DASH eating plan, aim to eat more fresh fruits and vegetables, whole grains, lean proteins, low-fat dairy, and heart-healthy fats.  Work with your health care provider or diet and nutrition specialist (dietitian) to adjust your eating plan to your individual calorie needs. This information is not intended to replace advice given to you by your health care provider. Make sure you discuss any questions you have with your health care provider. Document  Released: 06/09/2011 Document Revised: 06/13/2016 Document Reviewed: 06/13/2016 Elsevier Interactive Patient Education  2017 [employer].        IF you received an x-ray today, you will receive an invoice from [facility]. Please contact [facility] at [phone] with questions or concerns regarding your invoice.   IF you received labwork today, you will receive an invoice from [facility]. Please contact [facility] at [phone] with questions or concerns regarding your invoice.   Our billing staff will not be able to assist you with questions regarding bills from these companies.  You will be contacted with the lab results as soon as they are available. The fastest way to get your results is to activate your My Chart account. Instructions are located on the last page of this paperwork. If you have not heard from [place] regarding the results in 2 weeks, please contact this office.

## 2017-05-04 NOTE — Progress Notes (Signed)
PRIMARY CARE AT [facility] [address], [place] [address] [phone]  Date:  05/04/2017   Name:  Steven Benjamin   DOB:  1950/10/16   MRN:  [id number]  PCP:  Robyn Haber, MD    History of Present Illness:  Steven WHITHAM Sr. is a 66 y.o. male patient who presents to PCP with  Chief Complaint  Patient presents with  . Blood Pressure Check     Patient is here today for concern of elevated blood pressure.  Patient states that he has been having low back pain, as well as noticed some bloating.  After concerns of kidney stone, he went to urology who found his blood pressure systolic >361.  He notes that the CT was normal as reported by [facility] for possible kidney stone, however they were submitting for overread.  He has no chest pains, palpiations, or sob.  He has noted no vision changes.  Notes no coughing.  He reports that he has been taking his blood pressure medicines--though he can not name any of them.   He is taking cetirizine otc for allergies.  These do not contain any pseudefedrine, or other decongestants. Wife had added 2 pillows, a few days ago for his sleeping because he was breathing so loudly (not snoring), throughout the house.  Wife reports that this had remedied the loud breathing.  Patient however reports that he did not notice any difficulty with breathing or waking gasping for air.  He reports he is taking his anti-hypertensives, however he can not address which medicines these are or the names. Today, he had apple strudel, and a hamburger for within the last few hours. He reports that he has been taking his blood pressure medicines--though he can not name any of them.   He is taking cetirizine otc for allergies.  These do not contain any pseudefedrine, or other decongestants. Wife had added 2 pillows, a few days ago for his sleeping because he was breathing so loudly (not snoring), throughout the house.  Wife reports that this had remedied the loud breathing.   Patient however reports that he did not notice any difficulty with breathing or waking gasping for air.  Labs reviewed with elevated tsh 7.74 received 3 days prior to this visit.  Wife also is concerned that he is having similar swelling prior to passing.  She reports it as a rheumatoid disease though her exact diagnosis was not clear Patient Active Problem List   Diagnosis Date Noted  . ACUTE BRONCHITIS 08/11/2007  . HYPERLIPIDEMIA 09/27/2006  . ANEMIA-IRON DEFICIENCY 09/27/2006  . HYPERTENSION 09/27/2006  . ALLERGIC RHINITIS 09/27/2006  . DIVERTICULOSIS, COLON 09/27/2006  . COLONIC POLYPS, HX OF 09/27/2006    Past Medical History:  Diagnosis Date  . Hypertension   . Kidney stone     No past surgical history on file.  Social History  Substance Use Topics  . Smoking status: Former Research scientist (life sciences)  . Smokeless tobacco: Never Used  . Alcohol use No    Family History  Problem Relation Age of Onset  . Cancer Mother   . Heart disease Father     Allergies  Allergen Reactions  . Lipitor [Atorvastatin Calcium] Other (See Comments)    Muscle pains  . Z-Pak [Azithromycin]   . Erythromycin Rash    Medication list has been reviewed and updated.  Current Outpatient Prescriptions on File Prior to Visit  Medication Sig Dispense Refill  . cetirizine (ZYRTEC) 10 MG tablet Take 10  mg by mouth daily. Reported on 07/07/2015    . lisinopril (PRINIVIL,ZESTRIL) 40 MG tablet TAKE 1 TABLET(40 MG) BY MOUTH DAILY 90 tablet 1  . metoprolol succinate (TOPROL-XL) 25 MG 24 hr tablet Take 1 tablet (25 mg total) by mouth daily. 90 tablet 1  . metoprolol succinate (TOPROL-XL) 50 MG 24 hr tablet Take 1 tablet (50 mg total) by mouth daily. Take with or immediately following a meal. 90 tablet 1  . simvastatin (ZOCOR) 20 MG tablet Take 1 tablet (20 mg total) by mouth at bedtime. 90 tablet 1  . triamterene-hydrochlorothiazide (MAXZIDE-25) 37.5-25 MG tablet Take 1 tablet by mouth daily. 90 tablet 1   No current  facility-administered medications on file prior to visit.     ROS ROS otherwise unremarkable unless listed above.  Physical Examination: BP (!) 204/115   Pulse 75   Temp 97.8 F (36.6 C) (Oral)   Resp 16   Ht 5\' 5"  (1.651 m)   Wt 222 lb (100.7 kg)   SpO2 97%   BMI 36.94 kg/m  Ideal Body Weight: Weight in (lb) to have BMI = 25: 149.9  Physical Exam  Constitutional: He is oriented to person, place, and time. He appears well-developed and well-nourished. No distress.  HENT:  Head: Normocephalic and atraumatic.  Eyes: Conjunctivae and EOM are normal. Pupils are equal, round, and reactive to light.  Cardiovascular: Normal rate and regular rhythm.  Pulmonary/Chest: Effort normal. No respiratory distress.  Abdominal: Soft. Normal appearance and bowel sounds are normal. He exhibits distension and ascites (possible). He exhibits no abdominal bruit.  Lower extremity pitting edema.    Neurological: He is alert and oriented to person, place, and time.  Skin: Skin is warm and dry. He is not diaphoretic.  Psychiatric: He has a normal mood and affect. His behavior is normal.    Dg Chest 2 View  Result Date: 05/04/2017 CLINICAL DATA:  66 y/o  M; elevated blood pressure and orthopnea. EXAM: CHEST  2 VIEW COMPARISON:  06/29/2015 chest radiate FINDINGS: The heart size and mediastinal contours are within normal limits. Stable tortuous aorta. Both lungs are clear. The visualized skeletal structures are unremarkable. IMPRESSION: Stable tortuous aorta.  No acute pulmonary process identified. Electronically Signed   By: Kristine Garbe M.D.   On: 05/04/2017 15:59   Assessment and Plan: Steven CADAVID Sr. is a 66 y.o. male who is here today for cc of  Chief Complaint  Patient presents with  . Blood Pressure Check  separate note prepared to obtain the bnp, t3, t4  from blood work sent 3 days ago.  Advised to make sure this can be obtained prior to dismissal of patient today.  Added rheumatoid  factor at this time as well Starting on a furosemide.  They will follow up with Carlota Raspberry in 5 days.    Orthopnea - Plan: DG Chest 2 View, furosemide (LASIX) 20 MG tablet, potassium chloride (K-DUR) 10 MEQ tablet  Essential hypertension  Pedal edema  Ivar Drape, PA-C [facility] 11/6/201812:14 PM

## 2017-05-05 ENCOUNTER — Telehealth: Payer: Self-pay

## 2017-05-05 NOTE — Telephone Encounter (Signed)
Called pt to schedule Medicare Annual Wellness Visit with nurse health advisor. Patient said he would schedule in the future after he "gets some things taken care of".    Josepha Pigg, B.A.  Care Guide - Primary Care at [place]

## 2017-05-06 LAB — RHEUMATOID FACTOR: RHEUMATOID FACTOR: 56.5 [IU]/mL — AB (ref 0.0–13.9)

## 2017-05-06 LAB — SPECIMEN STATUS REPORT

## 2017-05-06 LAB — T4: T4, Total: 9.5 ug/dL (ref 4.5–12.0)

## 2017-05-06 LAB — PRO B NATRIURETIC PEPTIDE: NT-PRO BNP: 994 pg/mL — AB (ref 0–376)

## 2017-05-06 LAB — T3: T3, Total: 96 ng/dL (ref 71–180)

## 2017-05-08 ENCOUNTER — Telehealth (HOSPITAL_COMMUNITY): Payer: Self-pay | Admitting: Family Medicine

## 2017-05-08 NOTE — Telephone Encounter (Signed)
User: Cherie Dark A Date/time: 05/08/17 3:31 PM  Comment: Called pt and lmsg for him to CB to get sch for echo.   Context:  Outcome: Left Message  Phone number: [phone] Phone Type: Home Phone  Comm. type: Telephone Call type: Outgoing  Contact: [name] Relation to patient: Self

## 2017-05-09 ENCOUNTER — Ambulatory Visit (INDEPENDENT_AMBULATORY_CARE_PROVIDER_SITE_OTHER): Payer: Medicare Other | Admitting: Family Medicine

## 2017-05-09 ENCOUNTER — Encounter: Payer: Self-pay | Admitting: Physician Assistant

## 2017-05-09 ENCOUNTER — Encounter: Payer: Self-pay | Admitting: Family Medicine

## 2017-05-09 VITALS — BP 148/90 | HR 89 | Temp 98.1°F | Resp 16 | Ht 65.0 in | Wt 215.8 lb

## 2017-05-09 DIAGNOSIS — R600 Localized edema: Secondary | ICD-10-CM | POA: Diagnosis not present

## 2017-05-09 DIAGNOSIS — R7989 Other specified abnormal findings of blood chemistry: Secondary | ICD-10-CM

## 2017-05-09 DIAGNOSIS — R739 Hyperglycemia, unspecified: Secondary | ICD-10-CM

## 2017-05-09 DIAGNOSIS — D72829 Elevated white blood cell count, unspecified: Secondary | ICD-10-CM

## 2017-05-09 DIAGNOSIS — I10 Essential (primary) hypertension: Secondary | ICD-10-CM

## 2017-05-09 DIAGNOSIS — R768 Other specified abnormal immunological findings in serum: Secondary | ICD-10-CM

## 2017-05-09 NOTE — Progress Notes (Signed)
Subjective:    Patient ID: Steven Benjamin, male    DOB: May 04, 1951, 66 y.o.   MRN: [id number]  HPI Steven Benjamin is a 66 y.o. male Presents today for: Chief Complaint  Patient presents with  . Leg Swelling    follow up. Patient states that he is feeling better   Here for follow-up. See office visit October 30. History of pedal edema, noted earlier this year. Unable to have blood work at that time. Persistent pedal edema last visit with 2-3+ pitting edema. Had been experiencing pedal edema for the past few years. Weight had increased from 214 May 3rd to 222 last visit. However he was 224 in May 2017. Reported increased swelling with prolonged standing, improved with Maxide at last visit.  Additionally he did note some swelling of his abdomen for the prior 6 months to a year possibly worsening the prior few weeks. Episodic heartburn without other abdominal symptoms.  He was seen in follow-up on November 1 with [place]. He was seen here after elevated blood pressure of systolic greater than 315 when seen at urology for possible kidney stone. Note reviewed from that visit. He was sleeping on 2 pillows at that time due to loud breathing at night, but denied gasping for air or dyspnea.    There was some discussion about rheumatologic disease at last visit, rheumatoid factor was added to blood work. Mom had rheumatoid arthritis.  He has had some pains in legs with weather changes, but had injury from steel  beam in 1986. Occasional joint pain in r hand 1st finger.   Blood work noted below from prior visit with me.. Notable for elevated BNP, mildly elevated TSH, mildly elevated WBC, elevated glucose, as well as elevated rheumatoid factor.   Abdominal ultrasound pending, echocardiogram tomorrow.   He was started on Lasix 20 mg daily, potassium 10 mEq daily for past 5 days. Has continued maxzide, toprol and lisinopril. Stopped potassium after 2nd day as had diarrhea.  Eating bananas - 1-2 per day.  Diarrhea has resolved. Leg swelling is much better, abdominal swelling also improved. Dad had COPD, but passed with CHF.   No known history of diabetes, denies change in thirst or vision changes, no N/V/abd pain  No fevers, no cough, no dysuria.   Results for orders placed or performed in visit on 05/02/17  CBC  Result Value Ref Range   WBC 12.3 (H) 3.4 - 10.8 x10E3/uL   RBC 4.67 4.14 - 5.80 x10E6/uL   Hemoglobin 13.6 13.0 - 17.7 g/dL   Hematocrit 41.7 37.5 - 51.0 %   MCV 89 79 - 97 fL   MCH 29.1 26.6 - 33.0 pg   MCHC 32.6 31.5 - 35.7 g/dL   RDW 13.7 12.3 - 15.4 %   Platelets 317 150 - 379 x10E3/uL  Comprehensive metabolic panel  Result Value Ref Range   Glucose 166 (H) 65 - 99 mg/dL   BUN 20 8 - 27 mg/dL   Creatinine, Ser 1.05 0.76 - 1.27 mg/dL   GFR calc non Af Amer 73 >59 mL/min/1.73   GFR calc Af Amer 84 >59 mL/min/1.73   BUN/Creatinine Ratio 19 10 - 24   Sodium 139 134 - 144 mmol/L   Potassium 4.7 3.5 - 5.2 mmol/L   Chloride 98 96 - 106 mmol/L   CO2 26 20 - 29 mmol/L   Calcium 9.4 8.6 - 10.2 mg/dL   Total Protein 7.5 6.0 - 8.5 g/dL   Albumin 4.3  3.6 - 4.8 g/dL   Globulin, Total 3.2 1.5 - 4.5 g/dL   Albumin/Globulin Ratio 1.3 1.2 - 2.2   Bilirubin Total 0.3 0.0 - 1.2 mg/dL   Alkaline Phosphatase 77 39 - 117 IU/L   AST 22 0 - 40 IU/L   ALT 19 0 - 44 IU/L  TSH  Result Value Ref Range   TSH 7.780 (H) 0.450 - 4.500 uIU/mL  Pro b natriuretic peptide (BNP)  Result Value Ref Range   NT-Pro BNP 994 (H) 0 - 376 pg/mL  Rheumatoid factor  Result Value Ref Range   Rhuematoid fact SerPl-aCnc 56.5 (H) 0.0 - 13.9 IU/mL  T4  Result Value Ref Range   T4, Total 9.5 4.5 - 12.0 ug/dL  T3  Result Value Ref Range   T3, Total 96 71 - 180 ng/dL  Specimen status report  Result Value Ref Range   specimen status report Comment       Patient Active Problem List   Diagnosis Date Noted  . ACUTE BRONCHITIS 08/11/2007  . HYPERLIPIDEMIA 09/27/2006  . ANEMIA-IRON DEFICIENCY  09/27/2006  . Essential hypertension 09/27/2006  . ALLERGIC RHINITIS 09/27/2006  . DIVERTICULOSIS, COLON 09/27/2006  . COLONIC POLYPS, HX OF 09/27/2006   Past Medical History:  Diagnosis Date  . Hypertension   . Kidney stone    No past surgical history on file. Allergies  Allergen Reactions  . Lipitor [Atorvastatin Calcium] Other (See Comments)    Muscle pains  . Z-Pak [Azithromycin]   . Erythromycin Rash   Prior to Admission medications   Medication Sig Start Date End Date Taking? Authorizing Provider  cetirizine (ZYRTEC) 10 MG tablet Take 10 mg by mouth daily. Reported on 07/07/2015   Yes [provider]  furosemide (LASIX) 20 MG tablet Take 1 tablet (20 mg total) by mouth daily. 05/04/17  Yes English, Colletta Maryland D, PA  lisinopril (PRINIVIL,ZESTRIL) 40 MG tablet TAKE 1 TABLET(40 MG) BY MOUTH DAILY 05/02/17  Yes [name], MD  metoprolol succinate (TOPROL-XL) 25 MG 24 hr tablet Take 1 tablet (25 mg total) by mouth daily. 05/02/17  Yes [name], MD  metoprolol succinate (TOPROL-XL) 50 MG 24 hr tablet Take 1 tablet (50 mg total) by mouth daily. Take with or immediately following a meal. 05/02/17  Yes [name], MD  potassium chloride (K-DUR) 10 MEQ tablet Take 1 tablet (10 mEq total) by mouth daily. 05/04/17  Yes English, Colletta Maryland D, PA  simvastatin (ZOCOR) 20 MG tablet Take 1 tablet (20 mg total) by mouth at bedtime. 05/02/17  Yes [name], MD  triamterene-hydrochlorothiazide (MAXZIDE-25) 37.5-25 MG tablet Take 1 tablet by mouth daily. 05/02/17  Yes [name], MD   Social History   Socioeconomic History  . Marital status: Married    Spouse name: Not on file  . Number of children: Not on file  . Years of education: Not on file  . Highest education level: Not on file  Social Needs  . Financial resource strain: Not on file  . Food insecurity - worry: Not on file  . Food insecurity - inability: Not on file  . Transportation needs -  medical: Not on file  . Transportation needs - non-medical: Not on file  Occupational History  . Not on file  Tobacco Use  . Smoking status: Former Research scientist (life sciences)  . Smokeless tobacco: Never Used  Substance and Sexual Activity  . Alcohol use: No  . Drug use: No  . Sexual activity: Not on  file  Other Topics Concern  . Not on file  Social History Narrative  . Not on file    Review of Systems  Constitutional: Negative for chills, fatigue and fever.  Respiratory: Negative for cough, chest tightness and shortness of breath.   Cardiovascular: Positive for leg swelling (improved. ). Negative for chest pain.  Gastrointestinal: Positive for abdominal distention (swelling in abdomen improved. ) and diarrhea (but improving. ). Negative for abdominal pain.  Endocrine: Positive for polyuria (with lasix ). Negative for polydipsia.  Musculoskeletal: Negative for joint swelling.       Objective:   Physical Exam  Constitutional: He is oriented to person, place, and time. He appears well-developed and well-nourished.  HENT:  Head: Normocephalic and atraumatic.  Eyes: EOM are normal. Pupils are equal, round, and reactive to light.  Neck: No JVD present. Carotid bruit is not present.  Cardiovascular: Normal rate, regular rhythm and normal heart sounds.  No murmur heard. Pulmonary/Chest: Effort normal and breath sounds normal. He has no rales.  Musculoskeletal: He exhibits no edema.  Neurological: He is alert and oriented to person, place, and time.  Skin: Skin is warm and dry.  Psychiatric: He has a normal mood and affect.  Vitals reviewed.  Vitals:   05/09/17 1537  BP: (!) 146/90  Pulse: 89  Resp: 16  Temp: 98.1 F (36.7 C)  TempSrc: Oral  SpO2: 97%  Weight: 215 lb 12.8 oz (97.9 kg)  Height: 5\' 5"  (1.651 m)   Wt Readings from Last 3 Encounters:  05/09/17 215 lb 12.8 oz (97.9 kg)  05/04/17 222 lb (100.7 kg)  05/02/17 222 lb (100.7 kg)       Assessment & Plan:  WLADYSLAW HENRICHS is a 66  y.o. male Elevated brain natriuretic peptide (BNP) level - Plan: Ambulatory referral to Cardiology, Basic metabolic panel Pedal edema - Plan: Ambulatory referral to Cardiology  -Suspected CHF as contributor to pedal edema, abdominal swelling/possible ascites. Significant improvement since starting Lasix.  -Echocardiogram to be performed shortly, cardiology referral placed. Monitor electrolytes on Lasix, but no change in dosing for now.   Hypertension, unspecified type - Plan: Ambulatory referral to Cardiology, Basic metabolic panel  -Somewhat elevated, but improving. No change in meds for now, Lasix may also help to lower pressure.  Hyperglycemia - Plan: Basic metabolic panel, Hemoglobin A1c  -Hyperglycemia noted on prior labs. Check A1c for diabetes screening.  Leukocytosis, unspecified type - Plan: CBC  -Afebrile, denies any infectious symptoms currently. Repeated CBC. Ultrasound is pending for abdomen to evaluate for ascites.  Elevated TSH  -Mildly elevated, plan to recheck levels in next 4-6 weeks with free T4.  Rheumatoid factor positive  -History of rheumatoid arthritis in the family, his rheumatoid factor test was positive, but no significant joint swelling or persistent arthralgias at this time.   -Occasional pain in PIP of his second phalanx on right, but with minimal current symptoms, decided to defer further workup of elevated rheumatoid factor at this time. Can refer to rheumatology for further testing whenever he would like.  Recheck in 10 days  No orders of the defined types were placed in this encounter.  Patient Instructions   Keep appointment tomorrow for echo of heart. I will refer you to cardiology. Continue fluid pill once per day and potassium rich foods such as banana each day. I will repeat some blood tests, but want to recheck in next 10 days to repeat some testing.   Rheumatoid test was elevated, but without many  joint aches or swelling at this time, we can  discuss a plan at future visit. I'm happy to refer you to rheumatologist if needed.    Blood sugar elevated - I will check a diabetes test  Blood counts were elevated, I will repeat this today. If you have fever, cough, burning or difficulty with urination, return for other testing  Thyroid test was mildly elevated, but not at a level I would start medicines at this time. We can recheck that level in the next 4-6 weeks to see if medicine is needed then.  Recheck in 10 days, sooner if worse. Thanks for coming in today.   IF you received an x-ray today, you will receive an invoice from [facility]. Please contact [facility] at [phone] with questions or concerns regarding your invoice.   IF you received labwork today, you will receive an invoice from [place]. Please contact [facility] at [phone] with questions or concerns regarding your invoice.   Our billing staff will not be able to assist you with questions regarding bills from these companies.  You will be contacted with the lab results as soon as they are available. The fastest way to get your results is to activate your My Chart account. Instructions are located on the last page of this paperwork. If you have not heard from [place] regarding the results in 2 weeks, please contact this office.      I personally performed the services described in this documentation, which was scribed in my presence. The recorded information has been reviewed and considered for accuracy and completeness, addended by me as needed, and agree with information above.  Signed,   [name], MD Primary Care at [place].  05/10/17 3:26 PM

## 2017-05-09 NOTE — Patient Instructions (Addendum)
Keep appointment tomorrow for echo of heart. I will refer you to cardiology. Continue fluid pill once per day and potassium rich foods such as banana each day. I will repeat some blood tests, but want to recheck in next 10 days to repeat some testing.   Rheumatoid test was elevated, but without many joint aches or swelling at this time, we can discuss a plan at future visit. I'm happy to refer you to rheumatologist if needed.    Blood sugar elevated - I will check a diabetes test  Blood counts were elevated, I will repeat this today. If you have fever, cough, burning or difficulty with urination, return for other testing  Thyroid test was mildly elevated, but not at a level I would start medicines at this time. We can recheck that level in the next 4-6 weeks to see if medicine is needed then.  Recheck in 10 days, sooner if worse. Thanks for coming in today.   IF you received an x-ray today, you will receive an invoice from [facility]. Please contact [facility] at [phone] with questions or concerns regarding your invoice.   IF you received labwork today, you will receive an invoice from [facility]. Please contact [facility] at [phone] with questions or concerns regarding your invoice.   Our billing staff will not be able to assist you with questions regarding bills from these companies.  You will be contacted with the lab results as soon as they are available. The fastest way to get your results is to activate your My Chart account. Instructions are located on the last page of this paperwork. If you have not heard from [place] regarding the results in 2 weeks, please contact this office.

## 2017-05-10 ENCOUNTER — Ambulatory Visit (HOSPITAL_COMMUNITY): Payer: Medicare Other | Attending: Cardiovascular Disease

## 2017-05-10 ENCOUNTER — Other Ambulatory Visit: Payer: Self-pay

## 2017-05-10 DIAGNOSIS — D649 Anemia, unspecified: Secondary | ICD-10-CM | POA: Diagnosis not present

## 2017-05-10 DIAGNOSIS — E785 Hyperlipidemia, unspecified: Secondary | ICD-10-CM | POA: Diagnosis not present

## 2017-05-10 DIAGNOSIS — I10 Essential (primary) hypertension: Secondary | ICD-10-CM | POA: Diagnosis not present

## 2017-05-10 DIAGNOSIS — Z87891 Personal history of nicotine dependence: Secondary | ICD-10-CM | POA: Diagnosis not present

## 2017-05-10 DIAGNOSIS — R600 Localized edema: Secondary | ICD-10-CM | POA: Insufficient documentation

## 2017-05-10 DIAGNOSIS — I071 Rheumatic tricuspid insufficiency: Secondary | ICD-10-CM | POA: Insufficient documentation

## 2017-05-10 DIAGNOSIS — I371 Nonrheumatic pulmonary valve insufficiency: Secondary | ICD-10-CM | POA: Diagnosis not present

## 2017-05-10 LAB — BASIC METABOLIC PANEL
BUN / CREAT RATIO: 20 (ref 10–24)
BUN: 26 mg/dL (ref 8–27)
CALCIUM: 10.1 mg/dL (ref 8.6–10.2)
CHLORIDE: 94 mmol/L — AB (ref 96–106)
CO2: 27 mmol/L (ref 20–29)
Creatinine, Ser: 1.32 mg/dL — ABNORMAL HIGH (ref 0.76–1.27)
GFR calc Af Amer: 64 mL/min/{1.73_m2} (ref >59)
GFR calc non Af Amer: 55 mL/min/{1.73_m2} — ABNORMAL LOW (ref >59)
GLUCOSE: 188 mg/dL — AB (ref 65–99)
Potassium: 4.9 mmol/L (ref 3.5–5.2)
Sodium: 137 mmol/L (ref 134–144)

## 2017-05-10 LAB — CBC
Hematocrit: 43.7 % (ref 37.5–51.0)
Hemoglobin: 14.5 g/dL (ref 13.0–17.7)
MCH: 29 pg (ref 26.6–33.0)
MCHC: 33.2 g/dL (ref 31.5–35.7)
MCV: 87 fL (ref 79–97)
PLATELETS: 364 10*3/uL (ref 150–379)
RBC: 5 x10E6/uL (ref 4.14–5.80)
RDW: 13.7 % (ref 12.3–15.4)
WBC: 10.6 10*3/uL (ref 3.4–10.8)

## 2017-05-10 LAB — HEMOGLOBIN A1C
Est. average glucose Bld gHb Est-mCnc: 163 mg/dL
HEMOGLOBIN A1C: 7.3 % — AB (ref 4.8–5.6)

## 2017-05-10 MED ORDER — PERFLUTREN LIPID MICROSPHERE
1.0000 mL | INTRAVENOUS | Status: AC | PRN
  Administered 2017-05-10: 2 mL via INTRAVENOUS

## 2017-05-19 ENCOUNTER — Other Ambulatory Visit: Payer: Self-pay

## 2017-05-19 ENCOUNTER — Ambulatory Visit (INDEPENDENT_AMBULATORY_CARE_PROVIDER_SITE_OTHER): Payer: Medicare Other | Admitting: Family Medicine

## 2017-05-19 ENCOUNTER — Encounter: Payer: Self-pay | Admitting: Family Medicine

## 2017-05-19 VITALS — BP 140/88 | HR 73 | Temp 97.5°F | Resp 18 | Ht 65.0 in | Wt 215.8 lb

## 2017-05-19 DIAGNOSIS — R609 Edema, unspecified: Secondary | ICD-10-CM

## 2017-05-19 DIAGNOSIS — R059 Cough, unspecified: Secondary | ICD-10-CM

## 2017-05-19 DIAGNOSIS — E119 Type 2 diabetes mellitus without complications: Secondary | ICD-10-CM | POA: Diagnosis not present

## 2017-05-19 DIAGNOSIS — R05 Cough: Secondary | ICD-10-CM | POA: Diagnosis not present

## 2017-05-19 DIAGNOSIS — J22 Unspecified acute lower respiratory infection: Secondary | ICD-10-CM

## 2017-05-19 DIAGNOSIS — R7989 Other specified abnormal findings of blood chemistry: Secondary | ICD-10-CM

## 2017-05-19 DIAGNOSIS — R109 Unspecified abdominal pain: Secondary | ICD-10-CM | POA: Diagnosis not present

## 2017-05-19 MED ORDER — DOXYCYCLINE HYCLATE 100 MG PO TABS: 100.0000 mg | ORAL_TABLET | Freq: Two times a day (BID) | ORAL | 0 refills | Status: DC

## 2017-05-19 NOTE — Patient Instructions (Addendum)
If cough is not improved in next 2 days - start antibiotic. Coricidin or mucinex (blue package) ok if needed. Return to the clinic or go to the nearest emergency room if any of your symptoms worsen or new symptoms occur.  I will try to get the information from your recent CT scan and urology visit to determine next step.   I can check a pancreas test with your bloodwork today, but if you have abdominal pain, nausea or vomiting - please return right away or go to the emergency room.   I will repeat kidney function test and electrolytes.   We sent referral to heart doctor few days ago.  Let me know if you have not received a call within the next 1 week.   Walking most days per week as tolerated, and can discuss exercise with cardiologist.  See diet info below for diabetes. We can hold on new meds at this time, but will need to repeat testing in next few months.  Schedule eye doctor visit and let them know you have been diagnosed with diabetes. We can discuss diabetes plan further in the next few weeks.    Recheck 11/26 or 11/27. Sooner if needed   Diabetes Mellitus and Food It is important for you to manage your blood sugar (glucose) level. Your blood glucose level can be greatly affected by what you eat. Eating healthier foods in the appropriate amounts throughout the day at about the same time each day will help you control your blood glucose level. It can also help slow or prevent worsening of your diabetes mellitus. Healthy eating may even help you improve the level of your blood pressure and reach or maintain a healthy weight. General recommendations for healthful eating and cooking habits include:  Eating meals and snacks regularly. Avoid going long periods of time without eating to lose weight.  Eating a diet that consists mainly of plant-based foods, such as fruits, vegetables, nuts, legumes, and whole grains.  Using low-heat cooking methods, such as baking, instead of high-heat  cooking methods, such as deep frying.  Work with your dietitian to make sure you understand how to use the Nutrition Facts information on food labels. How can food affect me? Carbohydrates Carbohydrates affect your blood glucose level more than any other type of food. Your dietitian will help you determine how many carbohydrates to eat at each meal and teach you how to count carbohydrates. Counting carbohydrates is important to keep your blood glucose at a healthy level, especially if you are using insulin or taking certain medicines for diabetes mellitus. Alcohol Alcohol can cause sudden decreases in blood glucose (hypoglycemia), especially if you use insulin or take certain medicines for diabetes mellitus. Hypoglycemia can be a life-threatening condition. Symptoms of hypoglycemia (sleepiness, dizziness, and disorientation) are similar to symptoms of having too much alcohol. If your health care provider has given you approval to drink alcohol, do so in moderation and use the following guidelines:  Women should not have more than one drink per day, and men should not have more than two drinks per day. One drink is equal to: ? 12 oz of beer. ? 5 oz of wine. ? 1 oz of hard liquor.  Do not drink on an empty stomach.  Keep yourself hydrated. Have water, diet soda, or unsweetened iced tea.  Regular soda, juice, and other mixers might contain a lot of carbohydrates and should be counted.  What foods are not recommended? As you make food choices, it  is important to remember that all foods are not the same. Some foods have fewer nutrients per serving than other foods, even though they might have the same number of calories or carbohydrates. It is difficult to get your body what it needs when you eat foods with fewer nutrients. Examples of foods that you should avoid that are high in calories and carbohydrates but low in nutrients include:  Trans fats (most processed foods list trans fats on the  Nutrition Facts label).  Regular soda.  Juice.  Candy.  Sweets, such as cake, pie, doughnuts, and cookies.  Fried foods.  What foods can I eat? Eat nutrient-rich foods, which will nourish your body and keep you healthy. The food you should eat also will depend on several factors, including:  The calories you need.  The medicines you take.  Your weight.  Your blood glucose level.  Your blood pressure level.  Your cholesterol level.  You should eat a variety of foods, including:  Protein. ? Lean cuts of meat. ? Proteins low in saturated fats, such as fish, egg whites, and beans. Avoid processed meats.  Fruits and vegetables. ? Fruits and vegetables that may help control blood glucose levels, such as apples, mangoes, and yams.  Dairy products. ? Choose fat-free or low-fat dairy products, such as milk, yogurt, and cheese.  Grains, bread, pasta, and rice. ? Choose whole grain products, such as multigrain bread, whole oats, and brown rice. These foods may help control blood pressure.  Fats. ? Foods containing healthful fats, such as nuts, avocado, olive oil, canola oil, and fish.  Does everyone with diabetes mellitus have the same meal plan? Because every person with diabetes mellitus is different, there is not one meal plan that works for everyone. It is very important that you meet with a dietitian who will help you create a meal plan that is just right for you. This information is not intended to replace advice given to you by your health care provider. Make sure you discuss any questions you have with your health care provider. Document Released: 03/17/2005 Document Revised: 11/26/2015 Document Reviewed: 05/17/2013 Elsevier Interactive Patient Education  2017 [employer].    IF you received an x-ray today, you will receive an invoice from [facility]. Please contact [facility] at [phone] with questions or concerns regarding your invoice.    IF you received labwork today, you will receive an invoice from [place]. Please contact [facility] at [phone] with questions or concerns regarding your invoice.   Our billing staff will not be able to assist you with questions regarding bills from these companies.  You will be contacted with the lab results as soon as they are available. The fastest way to get your results is to activate your My Chart account. Instructions are located on the last page of this paperwork. If you have not heard from [place] regarding the results in 2 weeks, please contact this office.

## 2017-05-19 NOTE — Progress Notes (Addendum)
Subjective:  By signing my name below, I, Steven Benjamin, attest that this documentation has been prepared under the direction and in the presence of [name], MD. Electronically Signed: Moises Benjamin, [place]. 05/19/2017 , 10:33 AM .  Patient was seen in Room 1 .   Patient ID: Steven Benjamin, male    DOB: [birth year], 66 y.o.   MRN: [id number] Chief Complaint  Patient presents with  . Follow-up    patient presents for follow up visit, states that he has had some congestion in the past week with yellow mucus   HPI Steven Benjamin is a 66 y.o. male  Here for follow up of peripheral edema. He also has acute concerns of cough and nasal congestion. His wife accompanied him to office visit today.   Cough and nasal congestion Patient complains nasal congestion ongoing for about a week ago (9 days ago). He states everyone at home has had some symptoms: his daughter had fluid in her ears and his wife has had sinus issues. He's felt hot and cold intermittently, but nothing measured. He's blowing out and coughing up yellow phlegm, which was initially clear. He notes feeling worse with more congestion. He's tried OTC cough suppressant with some relief.   Pedal edema His last visit was on Nov 6th, and had been on 5 days of Lasix 20mg  QD at that time with significant improvement. He's also on potassium 31mEq QD. He did have to stop potassium due to diarrhea but ate bananas 1-2 a day with resolution of diarrhea. His leg swelling improved; pedal edema suspected CHF with BNP prior; referred to cardio and echocardiogram.   He had an echo done on Nov 7th, with EF 55-60%, but image quality was sub-optimal and technically difficult. There was mild hypertrophy, normal systolic function and wall motion, did have grade 1 diastolic dysfunction.   Wt Readings from Last 3 Encounters:  05/19/17 215 lb 12.8 oz (97.9 kg)  05/09/17 215 lb 12.8 oz (97.9 kg)  05/04/17 222 lb (100.7 kg)   He's still eating a banana  once a day. He hasn't seen cardiology yet as he hasn't received a call.   HTN  He takes Lisinopril, Maxzide and metoprolol. He denies any new complications with his medications.   Flank pain His wife mentions patient being seen at [facility] with history of kidney stones, and was seen by Dr. Gloriann Loan. He had CT scan done, which showed 2 small kidney stones, otherwise normal. Afterwards, they received a call after he looked at CT again, which showed inflammation around pancreas and lesions on adrenal gland. Patient notes having some pain in his back and flank pain feeling like kidney stones, but denies nausea or vomiting. He was concerned regarding lesions on the adrenal glands.   Elevated creatinine Lab Results  Component Value Date   CREATININE 1.32 (H) 05/09/2017   This had increased from 1.05 from 2 weeks ago. Of note, his potassium was normal 10 days ago.   Diabetes Lab Results  Component Value Date   HGBA1C 7.3 (H) 05/09/2017   He has new diagnosis based on most recent lab work. He did have an elevated glucose of 166 2 weeks ago, borderline at 100 almost 2 years ago. His Benjamin sugar was at 188 at last visit.   Lab Results  Component Value Date   GLUCOSE 188 (H) 05/09/2017   GLUCOSE 166 (H) 05/02/2017   GLUCOSE 100 (H) 06/29/2015   Vision: He hasn't seen eye doctor over  past year.   Patient Active Problem List   Diagnosis Date Noted  . ACUTE BRONCHITIS 08/11/2007  . HYPERLIPIDEMIA 09/27/2006  . ANEMIA-IRON DEFICIENCY 09/27/2006  . Essential hypertension 09/27/2006  . ALLERGIC RHINITIS 09/27/2006  . DIVERTICULOSIS, COLON 09/27/2006  . COLONIC POLYPS, HX OF 09/27/2006   Past Medical History:  Diagnosis Date  . Hypertension   . Kidney stone    No past surgical history on file. Allergies  Allergen Reactions  . Lipitor [Atorvastatin Calcium] Other (See Comments)    Muscle pains  . Z-Pak [Azithromycin]   . Erythromycin Rash   Prior to Admission medications     Medication Sig Start Date End Date Taking? Authorizing Provider  cetirizine (ZYRTEC) 10 MG tablet Take 10 mg by mouth daily. Reported on 07/07/2015    [provider]  furosemide (LASIX) 20 MG tablet Take 1 tablet (20 mg total) by mouth daily. 05/04/17   Ivar Drape D, PA  lisinopril (PRINIVIL,ZESTRIL) 40 MG tablet TAKE 1 TABLET(40 MG) BY MOUTH DAILY 05/02/17   [name], MD  metoprolol succinate (TOPROL-XL) 25 MG 24 hr tablet Take 1 tablet (25 mg total) by mouth daily. 05/02/17   [name], MD  metoprolol succinate (TOPROL-XL) 50 MG 24 hr tablet Take 1 tablet (50 mg total) by mouth daily. Take with or immediately following a meal. 05/02/17   [name], MD  potassium chloride (K-DUR) 10 MEQ tablet Take 1 tablet (10 mEq total) by mouth daily. Patient not taking: Reported on 05/19/2017 05/04/17   Ivar Drape D, PA  simvastatin (ZOCOR) 20 MG tablet Take 1 tablet (20 mg total) by mouth at bedtime. 05/02/17   [name], MD  triamterene-hydrochlorothiazide (MAXZIDE-25) 37.5-25 MG tablet Take 1 tablet by mouth daily. 05/02/17   [name], MD   Social History   Socioeconomic History  . Marital status: Married    Spouse name: Not on file  . Number of children: Not on file  . Years of education: Not on file  . Highest education level: Not on file  Social Needs  . Financial resource strain: Not on file  . Food insecurity - worry: Not on file  . Food insecurity - inability: Not on file  . Transportation needs - medical: Not on file  . Transportation needs - non-medical: Not on file  Occupational History  . Not on file  Tobacco Use  . Smoking status: Former Research scientist (life sciences)  . Smokeless tobacco: Never Used  Substance and Sexual Activity  . Alcohol use: No  . Drug use: No  . Sexual activity: Not on file  Other Topics Concern  . Not on file  Social History Narrative  . Not on file   Review of Systems  Constitutional: Positive for chills.  Negative for fatigue and unexpected weight change.  HENT: Positive for congestion.   Eyes: Negative for visual disturbance.  Respiratory: Positive for cough. Negative for chest tightness and shortness of breath.   Cardiovascular: Negative for chest pain, palpitations and leg swelling.  Gastrointestinal: Negative for abdominal pain, Benjamin in stool, nausea and vomiting.  Neurological: Negative for dizziness, light-headedness and headaches.       Objective:   Physical Exam  Constitutional: He is oriented to person, place, and time. He appears well-developed and well-nourished.  HENT:  Head: Normocephalic and atraumatic.  Right Ear: Tympanic membrane, external ear and ear canal normal.  Left Ear: Tympanic membrane, external ear and ear canal normal.  Nose: No rhinorrhea. Right sinus  exhibits no maxillary sinus tenderness and no frontal sinus tenderness. Left sinus exhibits no maxillary sinus tenderness and no frontal sinus tenderness.  Mouth/Throat: Oropharynx is clear and moist and mucous membranes are normal. No oropharyngeal exudate or posterior oropharyngeal erythema.  Small amount of clear discharge in both passages  Eyes: Conjunctivae and EOM are normal. Pupils are equal, round, and reactive to light.  Neck: Neck supple. No JVD present. Carotid bruit is not present.  Cardiovascular: Normal rate, regular rhythm, normal heart sounds and intact distal pulses.  No murmur heard. Pulmonary/Chest: Effort normal and breath sounds normal. He has no wheezes. He has no rhonchi. He has no rales.  Abdominal: Soft. Bowel sounds are normal. He exhibits no distension. There is no tenderness.  Prominent abdomen but not tight- feels less bloated than prior visits  Musculoskeletal: He exhibits no edema.  trace non pitting edema at the ankles  Lymphadenopathy:    He has no cervical adenopathy.  Neurological: He is alert and oriented to person, place, and time.  Skin: Skin is warm and dry. No rash  noted.  Psychiatric: He has a normal mood and affect. His behavior is normal.  Vitals reviewed.   Vitals:   05/19/17 0940  BP: 140/88  Pulse: 73  Resp: 18  Temp: (!) 97.5 F (36.4 C)  TempSrc: Oral  SpO2: 97%  Weight: 215 lb 12.8 oz (97.9 kg)  Height: 5\' 5"  (1.651 m)      Assessment & Plan:   JAHMAI FINELLI is a 66 y.o. male Peripheral edema  - Improved. Tolerating furosemide, continue same dose. Cardiology follow-up pending. Suspect some diastolic dysfunction.  LRTI (lower respiratory tract infection) - Plan: doxycycline (VIBRA-TABS) 100 MG tablet Cough  -Suspected initial viral illness, with persistent cough and some lower respiratory symptoms. Early bronchitis possible. Start doxycycline in next few days if not starting to improve. RTC precautions if worsening  Type 2 diabetes mellitus without complication, without long-term current use of insulin (HCC) - Plan: Microalbumin, urine  -New diagnosis. Check urine microalbumin, handout given on diet changes for diabetes and can monitor glucose readings next few months with repeat A1c in 3 months. Can discuss further at future visit.  Flank pain - Plan: Lipase  -Reportedly had some limitation or other abnormality around pancreas on recent CT scan when he was evaluated by urology for flank pain. Will check lipase, but without nausea/vomiting/abdominal pain unlikely pancreatitis  Elevated serum creatinine - Plan: Basic metabolic panel  -Repeat BMP to monitor creatinine as well as other electrolyte while he is taking furosemide.  Recheck in approximately 10 days.  Meds ordered this encounter  Medications  . doxycycline (VIBRA-TABS) 100 MG tablet    Sig: Take 1 tablet (100 mg total) 2 (two) times daily by mouth.    Dispense:  20 tablet    Refill:  0   Patient Instructions   If cough is not improved in next 2 days - start antibiotic. Coricidin or mucinex (blue package) ok if needed. Return to the clinic or go to the nearest  emergency room if any of your symptoms worsen or new symptoms occur.  I will try to get the information from your recent CT scan and urology visit to determine next step.   I can check a pancreas test with your bloodwork today, but if you have abdominal pain, nausea or vomiting - please return right away or go to the emergency room.   I will repeat kidney function test and electrolytes.  We sent referral to heart doctor few days ago.  Let me know if you have not received a call within the next 1 week.   Walking most days per week as tolerated, and can discuss exercise with cardiologist.  See diet info below for diabetes. We can hold on new meds at this time, but will need to repeat testing in next few months.  Schedule eye doctor visit and let them know you have been diagnosed with diabetes. We can discuss diabetes plan further in the next few weeks.    Recheck 11/26 or 11/27. Sooner if needed   Diabetes Mellitus and Food It is important for you to manage your Benjamin sugar (glucose) level. Your Benjamin glucose level can be greatly affected by what you eat. Eating healthier foods in the appropriate amounts throughout the day at about the same time each day will help you control your Benjamin glucose level. It can also help slow or prevent worsening of your diabetes mellitus. Healthy eating may even help you improve the level of your Benjamin pressure and reach or maintain a healthy weight. General recommendations for healthful eating and cooking habits include:  Eating meals and snacks regularly. Avoid going long periods of time without eating to lose weight.  Eating a diet that consists mainly of plant-based foods, such as fruits, vegetables, nuts, legumes, and whole grains.  Using low-heat cooking methods, such as baking, instead of high-heat cooking methods, such as deep frying.  Work with your dietitian to make sure you understand how to use the Nutrition Facts information on food labels. How  can food affect me? Carbohydrates Carbohydrates affect your Benjamin glucose level more than any other type of food. Your dietitian will help you determine how many carbohydrates to eat at each meal and teach you how to count carbohydrates. Counting carbohydrates is important to keep your Benjamin glucose at a healthy level, especially if you are using insulin or taking certain medicines for diabetes mellitus. Alcohol Alcohol can cause sudden decreases in Benjamin glucose (hypoglycemia), especially if you use insulin or take certain medicines for diabetes mellitus. Hypoglycemia can be a life-threatening condition. Symptoms of hypoglycemia (sleepiness, dizziness, and disorientation) are similar to symptoms of having too much alcohol. If your health care provider has given you approval to drink alcohol, do so in moderation and use the following guidelines:  Women should not have more than one drink per day, and men should not have more than two drinks per day. One drink is equal to: ? 12 oz of beer. ? 5 oz of wine. ? 1 oz of hard liquor.  Do not drink on an empty stomach.  Keep yourself hydrated. Have water, diet soda, or unsweetened iced tea.  Regular soda, juice, and other mixers might contain a lot of carbohydrates and should be counted.  What foods are not recommended? As you make food choices, it is important to remember that all foods are not the same. Some foods have fewer nutrients per serving than other foods, even though they might have the same number of calories or carbohydrates. It is difficult to get your body what it needs when you eat foods with fewer nutrients. Examples of foods that you should avoid that are high in calories and carbohydrates but low in nutrients include:  Trans fats (most processed foods list trans fats on the Nutrition Facts label).  Regular soda.  Juice.  Candy.  Sweets, such as cake, pie, doughnuts, and cookies.  Fried foods.  What foods  can I eat? Eat  nutrient-rich foods, which will nourish your body and keep you healthy. The food you should eat also will depend on several factors, including:  The calories you need.  The medicines you take.  Your weight.  Your Benjamin glucose level.  Your Benjamin pressure level.  Your cholesterol level.  You should eat a variety of foods, including:  Protein. ? Lean cuts of meat. ? Proteins low in saturated fats, such as fish, egg whites, and beans. Avoid processed meats.  Fruits and vegetables. ? Fruits and vegetables that may help control Benjamin glucose levels, such as apples, mangoes, and yams.  Dairy products. ? Choose fat-free or low-fat dairy products, such as milk, yogurt, and cheese.  Grains, bread, pasta, and rice. ? Choose whole grain products, such as multigrain bread, whole oats, and brown rice. These foods may help control Benjamin pressure.  Fats. ? Foods containing healthful fats, such as nuts, avocado, olive oil, canola oil, and fish.  Does everyone with diabetes mellitus have the same meal plan? Because every person with diabetes mellitus is different, there is not one meal plan that works for everyone. It is very important that you meet with a dietitian who will help you create a meal plan that is just right for you. This information is not intended to replace advice given to you by your health care provider. Make sure you discuss any questions you have with your health care provider. Document Released: 03/17/2005 Document Revised: 11/26/2015 Document Reviewed: 05/17/2013 Elsevier Interactive Patient Education  2017 [employer].    IF you received an x-ray today, you will receive an invoice from [facility]. Please contact [facility] at [phone] with questions or concerns regarding your invoice.   IF you received labwork today, you will receive an invoice from [place]. Please contact [facility] at [phone] with questions or concerns regarding your  invoice.   Our billing staff will not be able to assist you with questions regarding bills from these companies.  You will be contacted with the lab results as soon as they are available. The fastest way to get your results is to activate your My Chart account. Instructions are located on the last page of this paperwork. If you have not heard from [place] regarding the results in 2 weeks, please contact this office.      I personally performed the services described in this documentation, which was scribed in my presence. The recorded information has been reviewed and considered for accuracy and completeness, addended by me as needed, and agree with information above.  Signed,   [name], MD [facility] [place].  05/20/17 4:59 PM

## 2017-05-20 ENCOUNTER — Encounter: Payer: Self-pay | Admitting: Family Medicine

## 2017-05-20 LAB — BASIC METABOLIC PANEL
BUN/Creatinine Ratio: 23 (ref 10–24)
BUN: 36 mg/dL — ABNORMAL HIGH (ref 8–27)
CALCIUM: 10.1 mg/dL (ref 8.6–10.2)
CHLORIDE: 92 mmol/L — AB (ref 96–106)
CO2: 27 mmol/L (ref 20–29)
Creatinine, Ser: 1.6 mg/dL — ABNORMAL HIGH (ref 0.76–1.27)
GFR calc Af Amer: 50 mL/min/{1.73_m2} — ABNORMAL LOW (ref >59)
GFR calc non Af Amer: 44 mL/min/{1.73_m2} — ABNORMAL LOW (ref >59)
GLUCOSE: 136 mg/dL — AB (ref 65–99)
POTASSIUM: 4.6 mmol/L (ref 3.5–5.2)
Sodium: 135 mmol/L (ref 134–144)

## 2017-05-20 LAB — LIPASE: LIPASE: 41 U/L (ref 13–78)

## 2017-05-20 LAB — MICROALBUMIN, URINE: Microalbumin, Urine: 17.6 ug/mL

## 2017-05-23 ENCOUNTER — Telehealth: Payer: Self-pay

## 2017-05-23 NOTE — Telephone Encounter (Signed)
Copied from [place] #[id number]. Topic: Quick Communication - See Telephone Encounter >> May 22, 2017  2:22 PM Hewitt Shorts wrote: CRM for notification. See Telephone encounter for: 05/22/17. Pt was been taking doxycicline and believes it is causing soreness in his arms and shoulders and would like to speak with someone   Best number [phone] wife number

## 2017-05-29 ENCOUNTER — Encounter: Payer: Self-pay | Admitting: Family Medicine

## 2017-05-29 ENCOUNTER — Ambulatory Visit (INDEPENDENT_AMBULATORY_CARE_PROVIDER_SITE_OTHER): Payer: Medicare Other | Admitting: Family Medicine

## 2017-05-29 ENCOUNTER — Other Ambulatory Visit: Payer: Self-pay

## 2017-05-29 VITALS — BP 126/78 | HR 80 | Temp 97.7°F | Resp 16 | Ht 65.0 in | Wt 216.0 lb

## 2017-05-29 DIAGNOSIS — E119 Type 2 diabetes mellitus without complications: Secondary | ICD-10-CM

## 2017-05-29 DIAGNOSIS — R7989 Other specified abnormal findings of blood chemistry: Secondary | ICD-10-CM | POA: Diagnosis not present

## 2017-05-29 DIAGNOSIS — R600 Localized edema: Secondary | ICD-10-CM

## 2017-05-29 DIAGNOSIS — I10 Essential (primary) hypertension: Secondary | ICD-10-CM | POA: Diagnosis not present

## 2017-05-29 NOTE — Patient Instructions (Addendum)
Based on current amount of swelling and elevated kidney test, can decrease furosemide to 1/2 pill per day. Keep upcoming appointment with cardiology.   Kidney function has increased over the past few weeks. I will recheck that today, but if it is again elevated, would recommend other testing to determine cause. It may be worth discussing this with cardiology at your upcoming visit as well to determine if renal artery testing is needed.  For diabetes, watch diet and increase activity with walking(low intensity) to start. Call your eye specialist for appointment.   Diabetes Mellitus and Standards of Medical Care Managing diabetes (diabetes mellitus) can be complicated. Your diabetes treatment may be managed by a team of health care providers, including:  A diet and nutrition specialist (registered dietitian).  A nurse.  A certified diabetes educator (CDE).  A diabetes specialist (endocrinologist).  An eye doctor.  A primary care provider.  A dentist.  Your health care providers follow a schedule in order to help you get the best quality of care. The following schedule is a general guideline for your diabetes management plan. Your health care providers may also give you more specific instructions. HbA1c ( hemoglobin A1c) test This test provides information about blood sugar (glucose) control over the previous 2-3 months. It is used to check whether your diabetes management plan needs to be adjusted.  If you are meeting your treatment goals, this test is done at least 2 times a year.  If you are not meeting treatment goals or if your treatment goals have changed, this test is done 4 times a year.  Blood pressure test  This test is done at every routine medical visit. For most people, the goal is less than 130/80. Ask your health care provider what your goal blood pressure should be. Dental and eye exams  Visit your dentist two times a year.  If you have type 1 diabetes, get an eye  exam 3-5 years after you are diagnosed, and then once a year after your first exam. ? If you were diagnosed with type 1 diabetes as a child, get an eye exam when you are age 39 or older and have had diabetes for 3-5 years. After the first exam, you should get an eye exam once a year.  If you have type 2 diabetes, have an eye exam as soon as you are diagnosed, and then once a year after your first exam. Foot care exam  Visual foot exams are done at every routine medical visit. The exams check for cuts, bruises, redness, blisters, sores, or other problems with the feet.  A complete foot exam is done by your health care provider once a year. This exam includes an inspection of the structure and skin of your feet, and a check of the pulses and sensation in your feet. ? Type 1 diabetes: Get your first exam 3-5 years after diagnosis. ? Type 2 diabetes: Get your first exam as soon as you are diagnosed.  Check your feet every day for cuts, bruises, redness, blisters, or sores. If you have any of these or other problems that are not healing, contact your health care provider. Kidney function test ( urine microalbumin)  This test is done once a year. ? Type 1 diabetes: Get your first test 5 years after diagnosis. ? Type 2 diabetes: Get your first test as soon as you are diagnosed.  If you have chronic kidney disease (CKD), get a serum creatinine and estimated glomerular filtration rate (eGFR) test  once a year. Lipid profile (cholesterol, HDL, LDL, triglycerides)  This test should be done when you are diagnosed with diabetes, and every 5 years after the first test. If you are on medicines to lower your cholesterol, you may need to get this test done every year. ? The goal for LDL is less than 100 mg/dL (5.5 mmol/L). If you are at high risk, the goal is less than 70 mg/dL (3.9 mmol/L). ? The goal for HDL is 40 mg/dL (2.2 mmol/L) for men and 50 mg/dL(2.8 mmol/L) for women. An HDL cholesterol of 60 mg/dL  (3.3 mmol/L) or higher gives some protection against heart disease. ? The goal for triglycerides is less than 150 mg/dL (8.3 mmol/L). Immunizations  The yearly flu (influenza) vaccine is recommended for everyone 6 months or older who has diabetes.  The pneumonia (pneumococcal) vaccine is recommended for everyone 2 years or older who has diabetes. If you are 59 or older, you may get the pneumonia vaccine as a series of two separate shots.  The hepatitis B vaccine is recommended for adults shortly after they have been diagnosed with diabetes.  The Tdap (tetanus, diphtheria, and pertussis) vaccine should be given: ? According to normal childhood vaccination schedules, for children. ? Every 10 years, for adults who have diabetes.  The shingles vaccine is recommended for people who have had chicken pox and are 50 years or older. Mental and emotional health  Screening for symptoms of eating disorders, anxiety, and depression is recommended at the time of diagnosis and afterward as needed. If your screening shows that you have symptoms (you have a positive screening result), you may need further evaluation and be referred to a mental health care provider. Diabetes self-management education  Education about how to manage your diabetes is recommended at diagnosis and ongoing as needed. Treatment plan  Your treatment plan will be reviewed at every medical visit. Summary  Managing diabetes (diabetes mellitus) can be complicated. Your diabetes treatment may be managed by a team of health care providers.  Your health care providers follow a schedule in order to help you get the best quality of care.  Standards of care including having regular physical exams, blood tests, blood pressure monitoring, immunizations, screening tests, and education about how to manage your diabetes.  Your health care providers may also give you more specific instructions based on your individual health. This  information is not intended to replace advice given to you by your health care provider. Make sure you discuss any questions you have with your health care provider. Document Released: 04/17/2009 Document Revised: 03/18/2016 Document Reviewed: 03/18/2016 Elsevier Interactive Patient Education  2018 [employer].   IF you received an x-ray today, you will receive an invoice from [facility]. Please contact [facility] at [phone] with questions or concerns regarding your invoice.   IF you received labwork today, you will receive an invoice from [facility]. Please contact [facility] at [phone] with questions or concerns regarding your invoice.   Our billing staff will not be able to assist you with questions regarding bills from these companies.  You will be contacted with the lab results as soon as they are available. The fastest way to get your results is to activate your My Chart account. Instructions are located on the last page of this paperwork. If you have not heard from [place] regarding the results in 2 weeks, please contact this office.

## 2017-05-29 NOTE — Progress Notes (Signed)
Subjective:  By signing my name below, I, Steven Benjamin, attest that this documentation has been prepared under the direction and in the presence of [name], MD Electronically Signed: Ladene Artist, ED Scribe 05/29/2017 at 11:08 AM.   Patient ID: Steven Benjamin, male    DOB: May 07, 1951, 66 y.o.   MRN: [id number]  Chief Complaint  Patient presents with  . Follow-up    patient presents to clinic to iscuss lab results   HPI Steven Benjamin is a 66 y.o. male who presents to Primary Care at [facility] for follow-up. Last visit 11/16.   Pedal Edema Some improvement with starting Lasix 20 mg qd in early November. Did not tolerate potassium supplements so has been eating potassium rich foods. Suspected diastolic CHF. Prior elevated BNP of 994 on 10/30. Echo on 85/0 grade 1 diastolic dysfunction, ef 27-74%. BP was borderline but stable last visit. On Lisinopril, Metoprolol and Maxzide. Appointment with cardiologist Dr. Gwenlyn Found on 12/5.  Wt Readings from Last 3 Encounters:  05/29/17 216 lb (98 kg)  05/19/17 215 lb 12.8 oz (97.9 kg)  05/09/17 215 lb 12.8 oz (97.9 kg)  Pt states leg swelling has improved since last visit. Wife reports that pt is hesitant to resume activities such as yard work. Denies cp, sob, light-headedness, dizziness.  DM Recent diagnosis with A1C 7.3 on 11/6. Decided to attempt diet control initially with handout given last visit. Recommend optho exam for retinopathy screening. Microalbumin 17.6 at last visit. He is on an ACE inhibitor.   Elevated Creatinine Normal at 1.05 3 weeks ago. Increased to 1.32 after starting Lasix. Further increased to 1.6 at last visit.   Flank Pain See prior visits. H/o kidney stones elevated by urology. There was possible inflammation around the pancreas. Lipase normal last visit. Plan to obtain CT results to determine need for other imaging. Denies flank pain at this time.  Cough/Nasal Congestion See last visit. Pt states that he did complete  2 dose of doxycyline but then stopped due to arthralgias in L shoulder and L elbow. He did report that URI symptoms resolved and arthralgias eventually resolved after stopping doxycyline.  Patient Active Problem List   Diagnosis Date Noted  . ACUTE BRONCHITIS 08/11/2007  . HYPERLIPIDEMIA 09/27/2006  . ANEMIA-IRON DEFICIENCY 09/27/2006  . Essential hypertension 09/27/2006  . ALLERGIC RHINITIS 09/27/2006  . DIVERTICULOSIS, COLON 09/27/2006  . COLONIC POLYPS, HX OF 09/27/2006   Past Medical History:  Diagnosis Date  . Hypertension   . Kidney stone    History reviewed. No pertinent surgical history. Allergies  Allergen Reactions  . Lipitor [Atorvastatin Calcium] Other (See Comments)    Muscle pains  . Vibra-Tab [Doxycycline] Other (See Comments)    Muscle pain   . Z-Pak [Azithromycin]   . Erythromycin Rash   Prior to Admission medications   Medication Sig Start Date End Date Taking? Authorizing Provider  cetirizine (ZYRTEC) 10 MG tablet Take 10 mg by mouth daily. Reported on 07/07/2015   Yes [provider]  furosemide (LASIX) 20 MG tablet Take 1 tablet (20 mg total) by mouth daily. 05/04/17  Yes English, Colletta [place] D, PA  lisinopril (PRINIVIL,ZESTRIL) 40 MG tablet TAKE 1 TABLET(40 MG) BY MOUTH DAILY 05/02/17  Yes [name], MD  metoprolol succinate (TOPROL-XL) 25 MG 24 hr tablet Take 1 tablet (25 mg total) by mouth daily. 05/02/17  Yes [name], MD  metoprolol succinate (TOPROL-XL) 50 MG 24 hr tablet Take 1 tablet (50 mg total) by  mouth daily. Take with or immediately following a meal. 05/02/17  Yes [name], MD  simvastatin (ZOCOR) 20 MG tablet Take 1 tablet (20 mg total) by mouth at bedtime. 05/02/17  Yes [name], MD  triamterene-hydrochlorothiazide (MAXZIDE-25) 37.5-25 MG tablet Take 1 tablet by mouth daily. 05/02/17  Yes [name], MD  doxycycline (VIBRA-TABS) 100 MG tablet Take 1 tablet (100 mg total) 2 (two) times daily by  mouth. Patient not taking: Reported on 05/29/2017 05/19/17   [name], MD   Social History   Socioeconomic History  . Marital status: Married    Spouse name: Not on file  . Number of children: Not on file  . Years of education: Not on file  . Highest education level: Not on file  Social Needs  . Financial resource strain: Not on file  . Food insecurity - worry: Not on file  . Food insecurity - inability: Not on file  . Transportation needs - medical: Not on file  . Transportation needs - non-medical: Not on file  Occupational History  . Not on file  Tobacco Use  . Smoking status: Former Research scientist (life sciences)  . Smokeless tobacco: Never Used  Substance and Sexual Activity  . Alcohol use: No  . Drug use: No  . Sexual activity: Not on file  Other Topics Concern  . Not on file  Social History Narrative  . Not on file   Review of Systems  HENT: Negative for congestion (resolved).   Respiratory: Negative for cough (resolved) and shortness of breath.   Cardiovascular: Positive for leg swelling (improving). Negative for chest pain.  Genitourinary: Negative for flank pain.  Musculoskeletal: Negative for arthralgias (resolved).  Neurological: Negative for dizziness and light-headedness.      Objective:   Physical Exam  Constitutional: He is oriented to person, place, and time. He appears well-developed and well-nourished.  HENT:  Head: Normocephalic and atraumatic.  Eyes: EOM are normal. Pupils are equal, round, and reactive to light.  Neck: No JVD present. Carotid bruit is not present.  Cardiovascular: Normal rate, regular rhythm and normal heart sounds.  No murmur heard. Pulmonary/Chest: Effort normal and breath sounds normal. He has no rales.  Musculoskeletal: He exhibits edema.  Trace non-pitting edema at the ankles.  Neurological: He is alert and oriented to person, place, and time.  Skin: Skin is warm and dry.  Psychiatric: He has a normal mood and affect.  Vitals  reviewed.  Vitals:   05/29/17 1032  BP: 126/78  Pulse: 80  Resp: 16  Temp: 97.7 F (36.5 C)  TempSrc: Oral  SpO2: 98%  Weight: 216 lb (98 kg)  Height: '5\' 5"'  (1.651 m)      Assessment & Plan:   ASAF ELMQUIST is a 66 y.o. male Elevated serum creatinine - Plan: Basic metabolic panel  - repeat testing today. Has had further elevation past few weeks. May be related to some component of volume depletion with furosemide. Based on minimal edema at present will decrease furosemide to 10 mg daily, repeat creatinine today.  -If persistent elevations, could consider renal artery Doppler, but he has been on lisinopril for some time now and normal creatinine prior to the past few weeks.   Pedal edema  -Improved, decrease furosemide to 10 mg daily. Diastolic dysfunction on echo. Cardiology follow-up planned on December 5.  Type 2 diabetes mellitus without complication, without long-term current use of insulin ([place])  - Handout given on standards of care and typical  testing/follow-up. Provided diet information last visit. Plan on repeat testing next few months. Discussed increasing activity with low intensity exercise such as walking initially.   Essential hypertension  - Improved readings in office today. No change in medications other than decreasing Lasix as above.  Normal lipase was reviewed with patient, denies any abdominal/flank pain at present. I will obtain CT scan from urology to determine what abnormalities of adrenal glands are present and then determine further imaging from there.  No orders of the defined types were placed in this encounter.  Patient Instructions   Based on current amount of swelling and elevated kidney test, can decrease furosemide to 1/2 pill per day. Keep upcoming appointment with cardiology.   Kidney function has increased over the past few weeks. I will recheck that today, but if it is again elevated, would recommend other testing to determine cause. It may be  worth discussing this with cardiology at your upcoming visit as well to determine if renal artery testing is needed.  For diabetes, watch diet and increase activity with walking(low intensity) to start. Call your eye specialist for appointment.   Diabetes Mellitus and Standards of Medical Care Managing diabetes (diabetes mellitus) can be complicated. Your diabetes treatment may be managed by a team of health care providers, including:  A diet and nutrition specialist (registered dietitian).  A nurse.  A certified diabetes educator (CDE).  A diabetes specialist (endocrinologist).  An eye doctor.  A primary care provider.  A dentist.  Your health care providers follow a schedule in order to help you get the best quality of care. The following schedule is a general guideline for your diabetes management plan. Your health care providers may also give you more specific instructions. HbA1c ( hemoglobin A1c) test This test provides information about blood sugar (glucose) control over the previous 2-3 months. It is used to check whether your diabetes management plan needs to be adjusted.  If you are meeting your treatment goals, this test is done at least 2 times a year.  If you are not meeting treatment goals or if your treatment goals have changed, this test is done 4 times a year.  Blood pressure test  This test is done at every routine medical visit. For most people, the goal is less than 130/80. Ask your health care provider what your goal blood pressure should be. Dental and eye exams  Visit your dentist two times a year.  If you have type 1 diabetes, get an eye exam 3-5 years after you are diagnosed, and then once a year after your first exam. ? If you were diagnosed with type 1 diabetes as a child, get an eye exam when you are age 66 or older and have had diabetes for 3-5 years. After the first exam, you should get an eye exam once a year.  If you have type 2 diabetes, have an  eye exam as soon as you are diagnosed, and then once a year after your first exam. Foot care exam  Visual foot exams are done at every routine medical visit. The exams check for cuts, bruises, redness, blisters, sores, or other problems with the feet.  A complete foot exam is done by your health care provider once a year. This exam includes an inspection of the structure and skin of your feet, and a check of the pulses and sensation in your feet. ? Type 1 diabetes: Get your first exam 3-5 years after diagnosis. ? Type 2 diabetes: Get  your first exam as soon as you are diagnosed.  Check your feet every day for cuts, bruises, redness, blisters, or sores. If you have any of these or other problems that are not healing, contact your health care provider. Kidney function test ( urine microalbumin)  This test is done once a year. ? Type 1 diabetes: Get your first test 5 years after diagnosis. ? Type 2 diabetes: Get your first test as soon as you are diagnosed.  If you have chronic kidney disease (CKD), get a serum creatinine and estimated glomerular filtration rate (eGFR) test once a year. Lipid profile (cholesterol, HDL, LDL, triglycerides)  This test should be done when you are diagnosed with diabetes, and every 5 years after the first test. If you are on medicines to lower your cholesterol, you may need to get this test done every year. ? The goal for LDL is less than 100 mg/dL (5.5 mmol/L). If you are at high risk, the goal is less than 70 mg/dL (3.9 mmol/L). ? The goal for HDL is 40 mg/dL (2.2 mmol/L) for men and 50 mg/dL(2.8 mmol/L) for women. An HDL cholesterol of 60 mg/dL (3.3 mmol/L) or higher gives some protection against heart disease. ? The goal for triglycerides is less than 150 mg/dL (8.3 mmol/L). Immunizations  The yearly flu (influenza) vaccine is recommended for everyone 6 months or older who has diabetes.  The pneumonia (pneumococcal) vaccine is recommended for everyone 2  years or older who has diabetes. If you are 9 or older, you may get the pneumonia vaccine as a series of two separate shots.  The hepatitis B vaccine is recommended for adults shortly after they have been diagnosed with diabetes.  The Tdap (tetanus, diphtheria, and pertussis) vaccine should be given: ? According to normal childhood vaccination schedules, for children. ? Every 10 years, for adults who have diabetes.  The shingles vaccine is recommended for people who have had chicken pox and are 50 years or older. Mental and emotional health  Screening for symptoms of eating disorders, anxiety, and depression is recommended at the time of diagnosis and afterward as needed. If your screening shows that you have symptoms (you have a positive screening result), you may need further evaluation and be referred to a mental health care provider. Diabetes self-management education  Education about how to manage your diabetes is recommended at diagnosis and ongoing as needed. Treatment plan  Your treatment plan will be reviewed at every medical visit. Summary  Managing diabetes (diabetes mellitus) can be complicated. Your diabetes treatment may be managed by a team of health care providers.  Your health care providers follow a schedule in order to help you get the best quality of care.  Standards of care including having regular physical exams, blood tests, blood pressure monitoring, immunizations, screening tests, and education about how to manage your diabetes.  Your health care providers may also give you more specific instructions based on your individual health. This information is not intended to replace advice given to you by your health care provider. Make sure you discuss any questions you have with your health care provider. Document Released: 04/17/2009 Document Revised: 03/18/2016 Document Reviewed: 03/18/2016 Elsevier Interactive Patient Education  2018 [employer].   IF you  received an x-ray today, you will receive an invoice from [facility]iology. Please contact [facility] Radiology at [phone] with questions or concerns regarding your invoice.   IF you received labwork today, you will receive an invoice from [facility]. Please contact [facility]  at [phone] with questions or concerns regarding your invoice.   Our billing staff will not be able to assist you with questions regarding bills from these companies.  You will be contacted with the lab results as soon as they are available. The fastest way to get your results is to activate your My Chart account. Instructions are located on the last page of this paperwork. If you have not heard from [place] regarding the results in 2 weeks, please contact this office.

## 2017-05-31 ENCOUNTER — Telehealth: Payer: Self-pay | Admitting: Family Medicine

## 2017-05-31 NOTE — Telephone Encounter (Signed)
Please call patient. I received the CT scan report from urology on November 1st. There was a benign appearing adrenal adenoma or cyst, I do not think any workup is needed at this time for that area, but we can discuss it in more detail at his next visit.  However they did also note mild acute pancreatitis. If any return of abdominal pain, nausea, or vomiting, be seen right away as that condition sometimes needs to be treated in the hospital. Let me know if there are questions.

## 2017-05-31 NOTE — Telephone Encounter (Signed)
Tried to call pt today but pt did not answer and voicemail was not det up. Will try again later.

## 2017-06-02 NOTE — Telephone Encounter (Signed)
Attempted to call pt on cell - no VM set up. Called home # and LMOVM with message from Dr. Carlota Raspberry.  Advised to be seen asap if pt has abd pain, nausea/vomiting, either here or ED.

## 2017-06-07 ENCOUNTER — Encounter: Payer: Self-pay | Admitting: Cardiovascular Disease

## 2017-06-07 ENCOUNTER — Ambulatory Visit (INDEPENDENT_AMBULATORY_CARE_PROVIDER_SITE_OTHER): Payer: Medicare Other | Admitting: Cardiovascular Disease

## 2017-06-07 DIAGNOSIS — I10 Essential (primary) hypertension: Secondary | ICD-10-CM | POA: Diagnosis not present

## 2017-06-07 DIAGNOSIS — I5032 Chronic diastolic (congestive) heart failure: Secondary | ICD-10-CM

## 2017-06-07 NOTE — Assessment & Plan Note (Signed)
History of essential hypertension blood pressure measured at 124/98 although it is usually better than this. He is on lisinopril, metoprolol and Maxzide. Continue current meds at current dosing

## 2017-06-07 NOTE — Patient Instructions (Signed)
Medication Instructions: Your physician recommends that you continue on your current medications as directed. Please refer to the Current Medication list given to you today.   Labwork: Your physician recommends that you return for lab work: BNP   Follow-Up: We request that you follow-up in: 6 months with an extender and in 12 months with Dr Andria Rhein will receive a reminder letter in the mail two months in advance. If you don't receive a letter, please call our office to schedule the follow-up appointment.  If you need a refill on your cardiac medications before your next appointment, please call your pharmacy.

## 2017-06-07 NOTE — Assessment & Plan Note (Signed)
History of hyperlipidemia on statin therapy followed by his PCP 

## 2017-06-07 NOTE — Assessment & Plan Note (Signed)
Steven Benjamin was referred by Dr. Carlota Raspberry for pedal edema recently related to diastolic heart failure. Does have hypertension. He was eating a lot of salt she has since reduced. He was placed briefly on furosemide in addition to his Maxzide which resulted in marked improvement in his swelling. His BNP was elevated to approximately 1000. He denies chest pain or shortness of breath. I will recheck a BNP. I will see him back in one year with an interim visit with a mid-level provider in 6 months

## 2017-06-07 NOTE — Progress Notes (Signed)
06/07/2017 [name]   03/31/1951  [id number]  Primary Physician [name], MD Primary Cardiologist: Lorretta Harp MD Gar[facility] Reddish, [place], [place]  HPI:  Steven Benjamin is a 66 y.o. mildly overweight married Caucasian male father of 2, grandfather and 2 grandchildren accompanied by his wife Steven Benjamin today. He was referred by Dr. Carlota Raspberry for cardiovascular evaluation because of pedal edema and heart failure. He has a history of treated hypertension and hyperlipidemia. No bradycardia or stroke. There is no family history. He does not smoke or drink. He is noted to have pedal edema and was begun on furosemide in addition to his Maxzide with resultant diuresis and improvement in his edema. 2-D echo performed 05/10/17 revealed normal LV systolic function, grade 1 diastolic dysfunction with normal valves. His BNP was measured at 1000. He feels quickly improved after brief diuresis on his short-term furosemide. He is now aware salt restriction.    Current Meds  Medication Sig  . cetirizine (ZYRTEC) 10 MG tablet Take 10 mg by mouth daily. Reported on 07/07/2015  . furosemide (LASIX) 20 MG tablet Take 1 tablet (20 mg total) by mouth daily.  Marland Kitchen lisinopril (PRINIVIL,ZESTRIL) 40 MG tablet TAKE 1 TABLET(40 MG) BY MOUTH DAILY  . metoprolol succinate (TOPROL-XL) 25 MG 24 hr tablet Take 1 tablet (25 mg total) by mouth daily.  . metoprolol succinate (TOPROL-XL) 50 MG 24 hr tablet Take 1 tablet (50 mg total) by mouth daily. Take with or immediately following a meal.  . simvastatin (ZOCOR) 20 MG tablet Take 1 tablet (20 mg total) by mouth at bedtime.  . triamterene-hydrochlorothiazide (MAXZIDE-25) 37.5-25 MG tablet Take 1 tablet by mouth daily.     Allergies  Allergen Reactions  . Lipitor [Atorvastatin Calcium] Other (See Comments)    Muscle pains  . Vibra-Tab [Doxycycline] Other (See Comments)    Muscle pain   . Z-Pak [Azithromycin]   . Erythromycin Rash    Social History   Socioeconomic History    . Marital status: Married    Spouse name: Not on file  . Number of children: Not on file  . Years of education: Not on file  . Highest education level: Not on file  Social Needs  . Financial resource strain: Not on file  . Food insecurity - worry: Not on file  . Food insecurity - inability: Not on file  . Transportation needs - medical: Not on file  . Transportation needs - non-medical: Not on file  Occupational History  . Not on file  Tobacco Use  . Smoking status: Former Research scientist (life sciences)  . Smokeless tobacco: Never Used  Substance and Sexual Activity  . Alcohol use: No  . Drug use: No  . Sexual activity: Not on file  Other Topics Concern  . Not on file  Social History Narrative  . Not on file     Review of Systems: General: negative for chills, fever, night sweats or weight changes.  Cardiovascular: negative for chest pain, dyspnea on exertion, edema, orthopnea, palpitations, paroxysmal nocturnal dyspnea or shortness of breath Dermatological: negative for rash Respiratory: negative for cough or wheezing Urologic: negative for hematuria Abdominal: negative for nausea, vomiting, diarrhea, bright red blood per rectum, melena, or hematemesis Neurologic: negative for visual changes, syncope, or dizziness All other systems reviewed and are otherwise negative except as noted above.    Blood pressure (!) 124/98, pulse 61, height 5\' 5"  (1.651 m), weight 217 lb (98.4 kg).  General appearance: alert and no  distress Neck: no adenopathy, no carotid bruit, no JVD, supple, symmetrical, trachea midline and thyroid not enlarged, symmetric, no tenderness/mass/nodules Lungs: clear to auscultation bilaterally Heart: regular rate and rhythm, S1, S2 normal, no murmur, click, rub or gallop Extremities: extremities normal, atraumatic, no cyanosis or edema Pulses: 2+ and symmetric Skin: Skin color, texture, turgor normal. No rashes or lesions Neurologic: Alert and oriented X 3, normal strength and  tone. Normal symmetric reflexes. Normal coordination and gait  EKG sinus rhythm at 61 without ST or T-wave changes. I personally reviewed this EKG.  ASSESSMENT AND PLAN:   Chronic diastolic heart failure [facility]) Mr. Kolton was referred by Dr. Carlota Raspberry for pedal edema recently related to diastolic heart failure. Does have hypertension. He was eating a lot of salt she has since reduced. He was placed briefly on furosemide in addition to his Maxzide which resulted in marked improvement in his swelling. His BNP was elevated to approximately 1000. He denies chest pain or shortness of breath. I will recheck a BNP. I will see him back in one year with an interim visit with a mid-level provider in 6 months  Essential hypertension History of essential hypertension blood pressure measured at 124/98 although it is usually better than this. He is on lisinopril, metoprolol and Maxzide. Continue current meds at current dosing  HYPERLIPIDEMIA History of hyperlipidemia on statin therapy followed by his PCP      Lorretta Harp MD [facility], [facility] 06/07/2017 12:18 PM

## 2017-06-08 LAB — BRAIN NATRIURETIC PEPTIDE: BNP: 69.2 pg/mL (ref 0.0–100.0)

## 2017-08-14 ENCOUNTER — Ambulatory Visit (INDEPENDENT_AMBULATORY_CARE_PROVIDER_SITE_OTHER): Payer: Medicare Other | Admitting: Family Medicine

## 2017-08-14 ENCOUNTER — Other Ambulatory Visit: Payer: Self-pay

## 2017-08-14 ENCOUNTER — Encounter: Payer: Self-pay | Admitting: Family Medicine

## 2017-08-14 VITALS — BP 166/100 | HR 66 | Temp 98.2°F | Resp 18 | Ht 65.0 in | Wt 219.4 lb

## 2017-08-14 DIAGNOSIS — F411 Generalized anxiety disorder: Secondary | ICD-10-CM

## 2017-08-14 DIAGNOSIS — M2540 Effusion, unspecified joint: Secondary | ICD-10-CM | POA: Diagnosis not present

## 2017-08-14 DIAGNOSIS — R7989 Other specified abnormal findings of blood chemistry: Secondary | ICD-10-CM

## 2017-08-14 DIAGNOSIS — I5032 Chronic diastolic (congestive) heart failure: Secondary | ICD-10-CM

## 2017-08-14 DIAGNOSIS — F439 Reaction to severe stress, unspecified: Secondary | ICD-10-CM

## 2017-08-14 DIAGNOSIS — R768 Other specified abnormal immunological findings in serum: Secondary | ICD-10-CM

## 2017-08-14 DIAGNOSIS — I10 Essential (primary) hypertension: Secondary | ICD-10-CM | POA: Diagnosis not present

## 2017-08-14 MED ORDER — SERTRALINE HCL 25 MG PO TABS: 25.0000 mg | ORAL_TABLET | Freq: Every day | ORAL | 1 refills | Status: DC

## 2017-08-14 NOTE — Patient Instructions (Addendum)
Try sertraline once per day. See other information on stress management below. May also help meeting with your pastor regarding your symptoms. Let me know if you would like numbers to other counseling. Recheck in the next 2-3 weeks to see how that medication is doing as well as following up on your blood pressure.   I referred you to rheumatology, you should receive a call in the next 2 weeks.  Blood pressure is elevated today, and you do have some swelling in the legs. Restart furosemide initially one half pill per day.Keep a record of your blood pressures outside of the office and bring them to the next office visit.   Return to the clinic or go to the nearest emergency room if any of your symptoms worsen or new symptoms occur.    Stress and Stress Management Stress is a normal reaction to life events. It is what you feel when life demands more than you are used to or more than you can handle. Some stress can be useful. For example, the stress reaction can help you catch the last bus of the day, study for a test, or meet a deadline at work. But stress that occurs too often or for too long can cause problems. It can affect your emotional health and interfere with relationships and normal daily activities. Too much stress can weaken your immune system and increase your risk for physical illness. If you already have a medical problem, stress can make it worse. What are the causes? All sorts of life events may cause stress. An event that causes stress for one person may not be stressful for another person. Major life events commonly cause stress. These may be positive or negative. Examples include losing your job, moving into a new home, getting married, having a baby, or losing a loved one. Less obvious life events may also cause stress, especially if they occur day after day or in combination. Examples include working long hours, driving in traffic, caring for children, being in debt, or being in a  difficult relationship. What are the signs or symptoms? Stress may cause emotional symptoms including, the following:  Anxiety. This is feeling worried, afraid, on edge, overwhelmed, or out of control.  Anger. This is feeling irritated or impatient.  Depression. This is feeling sad, down, helpless, or guilty.  Difficulty focusing, remembering, or making decisions.  Stress may cause physical symptoms, including the following:  Aches and pains. These may affect your head, neck, back, stomach, or other areas of your body.  Tight muscles or clenched jaw.  Low energy or trouble sleeping.  Stress may cause unhealthy behaviors, including the following:  Eating to feel better (overeating) or skipping meals.  Sleeping too little, too much, or both.  Working too much or putting off tasks (procrastination).  Smoking, drinking alcohol, or using drugs to feel better.  How is this diagnosed? Stress is diagnosed through an assessment by your health care provider. Your health care provider will ask questions about your symptoms and any stressful life events.Your health care provider will also ask about your medical history and may order blood tests or other tests. Certain medical conditions and medicine can cause physical symptoms similar to stress. Mental illness can cause emotional symptoms and unhealthy behaviors similar to stress. Your health care provider may refer you to a mental health professional for further evaluation. How is this treated? Stress management is the recommended treatment for stress.The goals of stress management are reducing stressful life events and  coping with stress in healthy ways. Techniques for reducing stressful life events include the following:  Stress identification. Self-monitor for stress and identify what causes stress for you. These skills may help you to avoid some stressful events.  Time management. Set your priorities, keep a calendar of events, and  learn to say "no." These tools can help you avoid making too many commitments.  Techniques for coping with stress include the following:  Rethinking the problem. Try to think realistically about stressful events rather than ignoring them or overreacting. Try to find the positives in a stressful situation rather than focusing on the negatives.  Exercise. Physical exercise can release both physical and emotional tension. The key is to find a form of exercise you enjoy and do it regularly.  Relaxation techniques. These relax the body and mind. Examples include yoga, meditation, tai chi, biofeedback, deep breathing, progressive muscle relaxation, listening to music, being out in nature, journaling, and other hobbies. Again, the key is to find one or more that you enjoy and can do regularly.  Healthy lifestyle. Eat a balanced diet, get plenty of sleep, and do not smoke. Avoid using alcohol or drugs to relax.  Strong support network. Spend time with family, friends, or other people you enjoy being around.Express your feelings and talk things over with someone you trust.  Counseling or talktherapy with a mental health professional may be helpful if you are having difficulty managing stress on your own. Medicine is typically not recommended for the treatment of stress.Talk to your health care provider if you think you need medicine for symptoms of stress. Follow these instructions at home:  Keep all follow-up visits as directed by your health care provider.  Take all medicines as directed by your health care provider. Contact a health care provider if:  Your symptoms get worse or you start having new symptoms.  You feel overwhelmed by your problems and can no longer manage them on your own. Get help right away if:  You feel like hurting yourself or someone else. This information is not intended to replace advice given to you by your health care provider. Make sure you discuss any questions you  have with your health care provider. Document Released: 12/14/2000 Document Revised: 11/26/2015 Document Reviewed: 02/12/2013 Elsevier Interactive Patient Education  2017 [employer].        IF you received an x-ray today, you will receive an invoice from [facility]. Please contact [facility] at [phone] with questions or concerns regarding your invoice.   IF you received labwork today, you will receive an invoice from [facility]. Please contact [facility] at [phone] with questions or concerns regarding your invoice.   Our billing staff will not be able to assist you with questions regarding bills from these companies.  You will be contacted with the lab results as soon as they are available. The fastest way to get your results is to activate your My Chart account. Instructions are located on the last page of this paperwork. If you have not heard from [place] regarding the results in 2 weeks, please contact this office.

## 2017-08-14 NOTE — Progress Notes (Signed)
Subjective:  By signing my name below, I, Steven Benjamin, attest that this documentation has been prepared under the direction and in the presence of [name], MD Electronically Signed: Ladene Artist, ED Scribe 08/14/2017 at 10:46 AM.   Patient ID: Steven Benjamin, male    DOB: 1950-08-06, 67 y.o.   MRN: [id number]  Chief Complaint  Patient presents with  . Anxiety    pt states he is stressed out    HPI  Steven Benjamin is a 67 y.o. male who presents to Primary Care at [facility]. H/o HTN, chronic diastolic heart failure (grade 1 dysfunction on 05/10/17 echo), hyperlipidemia. Cardiologist: Dr. Gwenlyn Found. Planned f/u in June. BP at cardiology 12/5 124/98. Continued Lasix 20 mg qd, Maxzide, lisinopril, Toprol and Zocor for lipids. Increased from 1.32 to 1.6 after starting Lasix, initially 1.05. Has not had repeat testing since Nov. --- Pt states that he has been monitoring the salt in his diet. He has not been taking Lasix as he thought leg swelling resolved. Lab Results  Component Value Date   CREATININE 1.60 (H) 05/19/2017   Here today with anxiety symptoms. Pt reports increased anxiety and irritability when his grandchildren are around which he suspects is due to him becoming a grandfather at a later age. States "I can't stand loud noises; they just make me tense". He has noticed symptoms for at least 1 yr. He still does enjoyable activities including attending [employer], church and doing outdoor activities. Pt reports that his brother-in-law passed a little over a yr but he does not feel like he needs to meet with anyone to discuss this. Denies depression, SI/HI. Mother has a h/o anxiety. Depression screen [facility] 2/9 08/14/2017 05/29/2017 05/19/2017 05/09/2017 05/02/2017  Decreased Interest 0 0 0 0 0  Down, Depressed, Hopeless 0 0 0 0 0  PHQ - 2 Score 0 0 0 0 0   Elevated Rheumatoid Factor Discussed referral to rheumatology that was deferred at Dec visit. Reports family h/o RA. Pt has noticed  increased arthralgias in cold weather.  Patient Active Problem List   Diagnosis Date Noted  . Chronic diastolic heart failure ([place]) 06/07/2017  . ACUTE BRONCHITIS 08/11/2007  . HYPERLIPIDEMIA 09/27/2006  . ANEMIA-IRON DEFICIENCY 09/27/2006  . Essential hypertension 09/27/2006  . ALLERGIC RHINITIS 09/27/2006  . DIVERTICULOSIS, COLON 09/27/2006  . COLONIC POLYPS, HX OF 09/27/2006   Past Medical History:  Diagnosis Date  . Hypertension   . Kidney stone    No past surgical history on file. Allergies  Allergen Reactions  . Lipitor [Atorvastatin Calcium] Other (See Comments)    Muscle pains  . Vibra-Tab [Doxycycline] Other (See Comments)    Muscle pain   . Z-Pak [Azithromycin]   . Erythromycin Rash   Prior to Admission medications   Medication Sig Start Date End Date Taking? Authorizing Provider  cetirizine (ZYRTEC) 10 MG tablet Take 10 mg by mouth daily. Reported on 07/07/2015   Yes [provider]  lisinopril (PRINIVIL,ZESTRIL) 40 MG tablet TAKE 1 TABLET(40 MG) BY MOUTH DAILY 05/02/17  Yes [name], MD  metoprolol succinate (TOPROL-XL) 25 MG 24 hr tablet Take 1 tablet (25 mg total) by mouth daily. 05/02/17  Yes [name], MD  metoprolol succinate (TOPROL-XL) 50 MG 24 hr tablet Take 1 tablet (50 mg total) by mouth daily. Take with or immediately following a meal. 05/02/17  Yes [name], MD  simvastatin (ZOCOR) 20 MG tablet Take 1 tablet (20 mg total) by mouth  at bedtime. 05/02/17  Yes [name], MD  triamterene-hydrochlorothiazide (MAXZIDE-25) 37.5-25 MG tablet Take 1 tablet by mouth daily. 05/02/17  Yes [name], MD  furosemide (LASIX) 20 MG tablet Take 1 tablet (20 mg total) by mouth daily. Patient not taking: Reported on 08/14/2017 05/04/17   Joretta Bachelor, PA   Social History   Socioeconomic History  . Marital status: Married    Spouse name: Not on file  . Number of children: Not on file  . Years of education: Not on  file  . Highest education level: Not on file  Social Needs  . Financial resource strain: Not on file  . Food insecurity - worry: Not on file  . Food insecurity - inability: Not on file  . Transportation needs - medical: Not on file  . Transportation needs - non-medical: Not on file  Occupational History  . Not on file  Tobacco Use  . Smoking status: Former Research scientist (life sciences)  . Smokeless tobacco: Never Used  Substance and Sexual Activity  . Alcohol use: No  . Drug use: No  . Sexual activity: Not on file  Other Topics Concern  . Not on file  Social History Narrative  . Not on file   Review of Systems  Musculoskeletal: Positive for arthralgias.  Psychiatric/Behavioral: Negative for dysphoric mood and suicidal ideas. The patient is nervous/anxious.      Objective:   Physical Exam  Constitutional: He is oriented to person, place, and time. He appears well-developed and well-nourished.  HENT:  Head: Normocephalic and atraumatic.  Eyes: EOM are normal. Pupils are equal, round, and reactive to light.  Neck: No JVD present. Carotid bruit is not present.  Cardiovascular: Normal rate, regular rhythm and normal heart sounds.  No murmur heard. Pulmonary/Chest: Effort normal and breath sounds normal. He has no rales.  Musculoskeletal: He exhibits edema.  1+ pedal edema bilaterally  Neurological: He is alert and oriented to person, place, and time.  Skin: Skin is warm and dry.  Psychiatric: He has a normal mood and affect.  Vitals reviewed.  Vitals:   08/14/17 1013 08/14/17 1108  BP: (!) 162/98 (!) 166/100  Pulse: 66   Resp: 18   Temp: 98.2 F (36.8 C)   TempSrc: Oral   SpO2: 98%   Weight: 219 lb 6.4 oz (99.5 kg)   Height: '5\' 5"'$  (1.651 m)       Assessment & Plan:    [name] is a 67 y.o. male Anxiety state - Plan: sertraline (ZOLOFT) 25 MG tablet Situational stress  - Loss any symptoms, may have component of depression/anxiety with secondary adjustment disorder with change in  health recently. Decided to start Zoloft low-dose initially, declined counseling, recommend at least meeting with his pastor. Recheck next few weeks Handout given  Essential hypertension Chronic diastolic CHF (congestive heart failure) (HCC)  -Uncontrolled BP. Restart furosemide, monitor levels at home and recheck in the next 2-3 weeks.  Elevated serum creatinine - Plan: Basic metabolic panel  -Repeat BMP to check stability  Elevated rheumatoid factor - Plan: Ambulatory referral to Rheumatology Joint swelling - Plan: Ambulatory referral to Rheumatology  -Episodic joint swelling with family history of rheumatoid arthritis, personal history of elevated rheumatoid factor. Referred to rheumatology for further evaluation  Meds ordered this encounter  Medications  . sertraline (ZOLOFT) 25 MG tablet    Sig: Take 1 tablet (25 mg total) by mouth daily.    Dispense:  30 tablet    Refill:  1   Patient Instructions   Try sertraline once per day. See other information on stress management below. May also help meeting with your pastor regarding your symptoms. Let me know if you would like numbers to other counseling. Recheck in the next 2-3 weeks to see how that medication is doing as well as following up on your blood pressure.   I referred you to rheumatology, you should receive a call in the next 2 weeks.  Blood pressure is elevated today, and you do have some swelling in the legs. Restart furosemide initially one half pill per day.Keep a record of your blood pressures outside of the office and bring them to the next office visit.   Return to the clinic or go to the nearest emergency room if any of your symptoms worsen or new symptoms occur.    Stress and Stress Management Stress is a normal reaction to life events. It is what you feel when life demands more than you are used to or more than you can handle. Some stress can be useful. For example, the stress reaction can help you catch the  last bus of the day, study for a test, or meet a deadline at work. But stress that occurs too often or for too long can cause problems. It can affect your emotional health and interfere with relationships and normal daily activities. Too much stress can weaken your immune system and increase your risk for physical illness. If you already have a medical problem, stress can make it worse. What are the causes? All sorts of life events may cause stress. An event that causes stress for one person may not be stressful for another person. Major life events commonly cause stress. These may be positive or negative. Examples include losing your job, moving into a new home, getting married, having a baby, or losing a loved one. Less obvious life events may also cause stress, especially if they occur day after day or in combination. Examples include working long hours, driving in traffic, caring for children, being in debt, or being in a difficult relationship. What are the signs or symptoms? Stress may cause emotional symptoms including, the following:  Anxiety. This is feeling worried, afraid, on edge, overwhelmed, or out of control.  Anger. This is feeling irritated or impatient.  Depression. This is feeling sad, down, helpless, or guilty.  Difficulty focusing, remembering, or making decisions.  Stress may cause physical symptoms, including the following:  Aches and pains. These may affect your head, neck, back, stomach, or other areas of your body.  Tight muscles or clenched jaw.  Low energy or trouble sleeping.  Stress may cause unhealthy behaviors, including the following:  Eating to feel better (overeating) or skipping meals.  Sleeping too little, too much, or both.  Working too much or putting off tasks (procrastination).  Smoking, drinking alcohol, or using drugs to feel better.  How is this diagnosed? Stress is diagnosed through an assessment by your health care provider. Your health  care provider will ask questions about your symptoms and any stressful life events.Your health care provider will also ask about your medical history and may order blood tests or other tests. Certain medical conditions and medicine can cause physical symptoms similar to stress. Mental illness can cause emotional symptoms and unhealthy behaviors similar to stress. Your health care provider may refer you to a mental health professional for further evaluation. How is this treated? Stress management is the recommended treatment for stress.The goals of  stress management are reducing stressful life events and coping with stress in healthy ways. Techniques for reducing stressful life events include the following:  Stress identification. Self-monitor for stress and identify what causes stress for you. These skills may help you to avoid some stressful events.  Time management. Set your priorities, keep a calendar of events, and learn to say "no." These tools can help you avoid making too many commitments.  Techniques for coping with stress include the following:  Rethinking the problem. Try to think realistically about stressful events rather than ignoring them or overreacting. Try to find the positives in a stressful situation rather than focusing on the negatives.  Exercise. Physical exercise can release both physical and emotional tension. The key is to find a form of exercise you enjoy and do it regularly.  Relaxation techniques. These relax the body and mind. Examples include yoga, meditation, tai chi, biofeedback, deep breathing, progressive muscle relaxation, listening to music, being out in nature, journaling, and other hobbies. Again, the key is to find one or more that you enjoy and can do regularly.  Healthy lifestyle. Eat a balanced diet, get plenty of sleep, and do not smoke. Avoid using alcohol or drugs to relax.  Strong support network. Spend time with family, friends, or other people you  enjoy being around.Express your feelings and talk things over with someone you trust.  Counseling or talktherapy with a mental health professional may be helpful if you are having difficulty managing stress on your own. Medicine is typically not recommended for the treatment of stress.Talk to your health care provider if you think you need medicine for symptoms of stress. Follow these instructions at home:  Keep all follow-up visits as directed by your health care provider.  Take all medicines as directed by your health care provider. Contact a health care provider if:  Your symptoms get worse or you start having new symptoms.  You feel overwhelmed by your problems and can no longer manage them on your own. Get help right away if:  You feel like hurting yourself or someone else. This information is not intended to replace advice given to you by your health care provider. Make sure you discuss any questions you have with your health care provider. Document Released: 12/14/2000 Document Revised: 11/26/2015 Document Reviewed: 02/12/2013 Elsevier Interactive Patient Education  2017 [employer].        IF you received an x-ray today, you will receive an invoice from [facility]. Please contact [facility] at [phone] with questions or concerns regarding your invoice.   IF you received labwork today, you will receive an invoice from [place]. Please contact [facility] at [phone] with questions or concerns regarding your invoice.   Our billing staff will not be able to assist you with questions regarding bills from these companies.  You will be contacted with the lab results as soon as they are available. The fastest way to get your results is to activate your My Chart account. Instructions are located on the last page of this paperwork. If you have not heard from [place] regarding the results in 2 weeks, please contact this office.      I personally performed the  services described in this documentation, which was scribed in my presence. The recorded information has been reviewed and considered for accuracy and completeness, addended by me as needed, and agree with information above.  Signed,   [name], MD Primary Care at [place].  08/16/17 10:10  PM   

## 2017-08-15 LAB — BASIC METABOLIC PANEL
BUN / CREAT RATIO: 14 (ref 10–24)
BUN: 17 mg/dL (ref 8–27)
CHLORIDE: 101 mmol/L (ref 96–106)
CO2: 22 mmol/L (ref 20–29)
Calcium: 9.6 mg/dL (ref 8.6–10.2)
Creatinine, Ser: 1.2 mg/dL (ref 0.76–1.27)
GFR calc non Af Amer: 62 mL/min/{1.73_m2} (ref >59)
GFR, EST AFRICAN AMERICAN: 71 mL/min/{1.73_m2} (ref >59)
GLUCOSE: 172 mg/dL — AB (ref 65–99)
POTASSIUM: 4.9 mmol/L (ref 3.5–5.2)
SODIUM: 141 mmol/L (ref 134–144)

## 2017-08-28 ENCOUNTER — Other Ambulatory Visit: Payer: Self-pay

## 2017-08-28 ENCOUNTER — Ambulatory Visit (INDEPENDENT_AMBULATORY_CARE_PROVIDER_SITE_OTHER): Payer: Medicare Other | Admitting: Family Medicine

## 2017-08-28 ENCOUNTER — Encounter: Payer: Self-pay | Admitting: Family Medicine

## 2017-08-28 VITALS — BP 160/62 | HR 58 | Temp 98.2°F | Resp 16 | Ht 65.0 in | Wt 214.2 lb

## 2017-08-28 DIAGNOSIS — R600 Localized edema: Secondary | ICD-10-CM

## 2017-08-28 DIAGNOSIS — I10 Essential (primary) hypertension: Secondary | ICD-10-CM | POA: Diagnosis not present

## 2017-08-28 DIAGNOSIS — F418 Other specified anxiety disorders: Secondary | ICD-10-CM | POA: Diagnosis not present

## 2017-08-28 NOTE — Progress Notes (Signed)
Subjective:  This chart was scribed for [name], MD by Tamsen Roers, at [place] at [facility].  This patient was seen in room 12 and the patient's care was started at 10:59 AM.   Chief Complaint  Patient presents with  . Leg Swelling    both     Patient ID: Steven Benjamin, male    DOB: 05/22/1951, 67 y.o.   MRN: [id number]  HPI HPI Comments: Steven Benjamin is a 67 y.o. male who presents to Primary Care at [facility] for bilateral leg swelling.  He has a history of hypertension, anemia and chornic diastolic heart failure.  He was treated previously with Lasix 20 mg QD for lower extremity edema. Did have slight increase in his creatinine with Lasix.  He had stopped taking Lasix at last visit as improved leg swelling. Recommended he restart Furosamide (recommended  initially starting at 10 mg QD). Last visit as elevated BP.  ---- Patient has been compliant with his Furosamide - takes 1/2-1 pill depending on his activity levels (takes 1 full pill 2/7 days, rest of the days he is taking half of a pill).  He was still taking 1/2 a pill of his Furosamide before his last office visit.   Patient denies missing any doses of his blood pressure medications.  He feels that his swelling has improved since his last visit. He takes his Lasix in the morning. Patient checks his blood pressure outside of the office and states that he has normal readings.  He is not sure why his BP runs high when he is in the office.     BP Readings from Last 3 Encounters:  08/28/17 (!) 160/62  08/14/17 (!) 166/100  06/07/17 (!) 124/98   Lab Results  Component Value Date   CREATININE 1.20 08/14/2017    Anxiety: See last visit February 11 th. Was started on Zoloft 25 mg QD. Stress management techniques also discussed with hand out. ---- Patient states that his stress/irritability has gotten much better.  He has been compliant with the medication and denies any side effects.  He has been talking to his pastor and  has found a way to not let things get to him so easily.  Things have now become more tolerable.  He denies any thoughts of suicide or harming others.    Patient Active Problem List   Diagnosis Date Noted  . Chronic diastolic heart failure ([place]) 06/07/2017  . ACUTE BRONCHITIS 08/11/2007  . HYPERLIPIDEMIA 09/27/2006  . ANEMIA-IRON DEFICIENCY 09/27/2006  . Essential hypertension 09/27/2006  . ALLERGIC RHINITIS 09/27/2006  . DIVERTICULOSIS, COLON 09/27/2006  . COLONIC POLYPS, HX OF 09/27/2006   Past Medical History:  Diagnosis Date  . Hypertension   . Kidney stone    No past surgical history on file. Allergies  Allergen Reactions  . Lipitor [Atorvastatin Calcium] Other (See Comments)    Muscle pains  . Vibra-Tab [Doxycycline] Other (See Comments)    Muscle pain   . Z-Pak [Azithromycin]   . Erythromycin Rash   Prior to Admission medications   Medication Sig Start Date End Date Taking? Authorizing Provider  cetirizine (ZYRTEC) 10 MG tablet Take 10 mg by mouth daily. Reported on 07/07/2015   Yes [provider]  furosemide (LASIX) 20 MG tablet Take 1 tablet (20 mg total) by mouth daily. 05/04/17  Yes English, Colletta Maryland D, PA  lisinopril (PRINIVIL,ZESTRIL) 40 MG tablet TAKE 1 TABLET(40 MG) BY MOUTH DAILY 05/02/17  Yes [name], MD  metoprolol succinate (TOPROL-XL) 25 MG 24 hr tablet Take 1 tablet (25 mg total) by mouth daily. 05/02/17  Yes [name], MD  metoprolol succinate (TOPROL-XL) 50 MG 24 hr tablet Take 1 tablet (50 mg total) by mouth daily. Take with or immediately following a meal. 05/02/17  Yes [name], MD  sertraline (ZOLOFT) 25 MG tablet Take 1 tablet (25 mg total) by mouth daily. 08/14/17  Yes [name], MD  simvastatin (ZOCOR) 20 MG tablet Take 1 tablet (20 mg total) by mouth at bedtime. 05/02/17  Yes [name], MD  triamterene-hydrochlorothiazide (MAXZIDE-25) 37.5-25 MG tablet Take 1 tablet by mouth daily. 05/02/17  Yes  [name], MD   Social History   Socioeconomic History  . Marital status: Married    Spouse name: Not on file  . Number of children: Not on file  . Years of education: Not on file  . Highest education level: Not on file  Social Needs  . Financial resource strain: Not on file  . Food insecurity - worry: Not on file  . Food insecurity - inability: Not on file  . Transportation needs - medical: Not on file  . Transportation needs - non-medical: Not on file  Occupational History  . Not on file  Tobacco Use  . Smoking status: Former Research scientist (life sciences)  . Smokeless tobacco: Never Used  Substance and Sexual Activity  . Alcohol use: No  . Drug use: No  . Sexual activity: Not on file  Other Topics Concern  . Not on file  Social History Narrative  . Not on file    Review of Systems  Constitutional: Negative for chills and fever.  Eyes: Negative for pain and redness.  Respiratory: Negative for cough and shortness of breath.   Cardiovascular: Positive for leg swelling.  Gastrointestinal: Negative for nausea and vomiting.  Neurological: Negative for syncope and speech difficulty.      Objective:   Physical Exam  Constitutional: He is oriented to person, place, and time. He appears well-developed and well-nourished.  HENT:  Head: Normocephalic and atraumatic.  Eyes: EOM are normal. Pupils are equal, round, and reactive to light.  Neck: No JVD present. Carotid bruit is not present.  Cardiovascular: Normal rate, regular rhythm and normal heart sounds.  No murmur heard. 1-2 + pedal edema, slight pitting but soft.   Pulmonary/Chest: Effort normal and breath sounds normal. He has no rales.  Musculoskeletal: He exhibits no edema.  Neurological: He is alert and oriented to person, place, and time.  Skin: Skin is warm and dry.  Psychiatric: He has a normal mood and affect.  Vitals reviewed.   Vitals:   08/28/17 1033 08/28/17 1037  BP: (!) 160/94 (!) 160/62  Pulse: (!) 58   Resp:  16   Temp: 98.2 F (36.8 C)   TempSrc: Oral   SpO2: 98%   Weight: 214 lb 3.2 oz (97.2 kg)   Height: 5\' 5"  (1.651 m)      Assessment & Plan:  Steven Benjamin is a 67 y.o. male Situational anxiety  - Improved with low dose Zoloft as well as meeting with his pastor. Continue to work on Careers information officer given on handout, no change in Zoloft dose for now. Monitor blood pressure on that treatment, recheck in one month.   Leg edema  -Slightly improved, continue Lasix as able to 20 mg daily, or at minimum half pill most days per week  Essential hypertension  -Elevated in office,  reported lower ovaries. Potentially has component of whitecoat hypertension. If elevated home readings, return sooner than one month. Plan on recheck kidney function next visit  No orders of the defined types were placed in this encounter.  Patient Instructions   Keep a record of your blood pressures outside of the office and bring them to the next office visit in one month. If they are remaining over 140/90, return sooner.   Furosemide 1/2-1 pill per day for leg swelling. That will also help blood pressure. We can recheck your kidney function next visit.  Continue same dose of sertraline for now, follow up in one month.  Return to the clinic or go to the nearest emergency room if any of your symptoms worsen or new symptoms occur.   IF you received an x-ray today, you will receive an invoice from [facility]. Please contact [facility] at [phone] with questions or concerns regarding your invoice.   IF you received labwork today, you will receive an invoice from [place]. Please contact [facility] at [phone] with questions or concerns regarding your invoice.   Our billing staff will not be able to assist you with questions regarding bills from these companies.  You will be contacted with the lab results as soon as they are available. The fastest way to get your results is to  activate your My Chart account. Instructions are located on the last page of this paperwork. If you have not heard from [place] regarding the results in 2 weeks, please contact this office.      I personally performed the services described in this documentation, which was scribed in my presence. The recorded information has been reviewed and considered for accuracy and completeness, addended by me as needed, and agree with information above.  Signed,   [name], MD Primary Care at [place].  08/28/17 3:21 PM

## 2017-08-28 NOTE — Patient Instructions (Addendum)
Keep a record of your blood pressures outside of the office and bring them to the next office visit in one month. If they are remaining over 140/90, return sooner.   Furosemide 1/2-1 pill per day for leg swelling. That will also help blood pressure. We can recheck your kidney function next visit.  Continue same dose of sertraline for now, follow up in one month.  Return to the clinic or go to the nearest emergency room if any of your symptoms worsen or new symptoms occur.   IF you received an x-ray today, you will receive an invoice from [facility]. Please contact [facility] at [phone] with questions or concerns regarding your invoice.   IF you received labwork today, you will receive an invoice from [place]. Please contact [facility] at [phone] with questions or concerns regarding your invoice.   Our billing staff will not be able to assist you with questions regarding bills from these companies.  You will be contacted with the lab results as soon as they are available. The fastest way to get your results is to activate your My Chart account. Instructions are located on the last page of this paperwork. If you have not heard from [place] regarding the results in 2 weeks, please contact this office.

## 2017-08-30 DIAGNOSIS — M79642 Pain in left hand: Secondary | ICD-10-CM | POA: Diagnosis not present

## 2017-08-30 DIAGNOSIS — M19041 Primary osteoarthritis, right hand: Secondary | ICD-10-CM | POA: Diagnosis not present

## 2017-08-30 DIAGNOSIS — M19042 Primary osteoarthritis, left hand: Secondary | ICD-10-CM | POA: Diagnosis not present

## 2017-08-30 DIAGNOSIS — M064 Inflammatory polyarthropathy: Secondary | ICD-10-CM | POA: Diagnosis not present

## 2017-08-30 DIAGNOSIS — M79641 Pain in right hand: Secondary | ICD-10-CM | POA: Diagnosis not present

## 2017-08-30 DIAGNOSIS — M1990 Unspecified osteoarthritis, unspecified site: Secondary | ICD-10-CM | POA: Diagnosis not present

## 2017-08-30 DIAGNOSIS — M7989 Other specified soft tissue disorders: Secondary | ICD-10-CM | POA: Diagnosis not present

## 2017-09-13 DIAGNOSIS — M1990 Unspecified osteoarthritis, unspecified site: Secondary | ICD-10-CM | POA: Diagnosis not present

## 2017-09-13 DIAGNOSIS — R768 Other specified abnormal immunological findings in serum: Secondary | ICD-10-CM | POA: Diagnosis not present

## 2017-09-25 ENCOUNTER — Ambulatory Visit (INDEPENDENT_AMBULATORY_CARE_PROVIDER_SITE_OTHER): Payer: Medicare Other | Admitting: Family Medicine

## 2017-09-25 ENCOUNTER — Encounter: Payer: Self-pay | Admitting: Family Medicine

## 2017-09-25 ENCOUNTER — Other Ambulatory Visit: Payer: Self-pay

## 2017-09-25 VITALS — BP 150/82 | HR 60 | Temp 98.3°F | Resp 18 | Ht 65.0 in | Wt 214.8 lb

## 2017-09-25 DIAGNOSIS — I5032 Chronic diastolic (congestive) heart failure: Secondary | ICD-10-CM | POA: Diagnosis not present

## 2017-09-25 DIAGNOSIS — E785 Hyperlipidemia, unspecified: Secondary | ICD-10-CM

## 2017-09-25 DIAGNOSIS — F411 Generalized anxiety disorder: Secondary | ICD-10-CM | POA: Diagnosis not present

## 2017-09-25 DIAGNOSIS — I10 Essential (primary) hypertension: Secondary | ICD-10-CM | POA: Diagnosis not present

## 2017-09-25 DIAGNOSIS — E119 Type 2 diabetes mellitus without complications: Secondary | ICD-10-CM

## 2017-09-25 MED ORDER — SIMVASTATIN 20 MG PO TABS: 20.0000 mg | ORAL_TABLET | Freq: Every day | ORAL | 1 refills | Status: DC

## 2017-09-25 MED ORDER — METOPROLOL SUCCINATE ER 25 MG PO TB24: 25.0000 mg | ORAL_TABLET | Freq: Every day | ORAL | 1 refills | Status: DC

## 2017-09-25 MED ORDER — TRIAMTERENE-HCTZ 37.5-25 MG PO TABS: 1.0000 | ORAL_TABLET | Freq: Every day | ORAL | 1 refills | Status: DC

## 2017-09-25 MED ORDER — METOPROLOL SUCCINATE ER 50 MG PO TB24: 50.0000 mg | ORAL_TABLET | Freq: Every day | ORAL | 1 refills | Status: DC

## 2017-09-25 MED ORDER — FUROSEMIDE 20 MG PO TABS: 20.0000 mg | ORAL_TABLET | Freq: Every day | ORAL | 1 refills | Status: DC

## 2017-09-25 MED ORDER — LISINOPRIL 40 MG PO TABS: ORAL_TABLET | ORAL | 1 refills | Status: DC

## 2017-09-25 MED ORDER — SERTRALINE HCL 25 MG PO TABS: 25.0000 mg | ORAL_TABLET | Freq: Every day | ORAL | 1 refills | Status: DC

## 2017-09-25 NOTE — Progress Notes (Signed)
Subjective:  By signing my name below, I, Steven Benjamin, attest that this documentation has been prepared under the direction and in the presence of Steven Ray, MD. Electronically Signed: Moises Benjamin, [place]. 09/25/2017 , 10:48 AM .  Patient was seen in Room 11 .   Patient ID: Steven Benjamin, male    DOB: 06-27-1951, 67 y.o.   MRN: [id number] Chief Complaint  Patient presents with  . Hypertension    follow up    HPI [name] is a 67 y.o. male  Patient is here for follow up. Last seen on Feb 25th. He isn't fasting today.   Anxiety Thought to have situational anxiety with some long term symptoms. He had improved with starting Zoloft and meeting with his pastor. He was continued on same dose of 25mg  QD. BP was slightly elevated at last visit. Recommended continue monitoring.   Patient states he's doing well on Zoloft. He denies any side effects with the Zoloft. He's doing better with his grandchildren.   HTN BP elevated in last office visit, reported lower home readings. He had some persistent leg edema that had improved. He was continued on Lasix half to 1 tablet a day, Lisinopril 40mg  QD, toprol 75mg  QD and Maxzide 37.5-25mg  QD. He did meet with his cardiologist in Dec, plan for 6 months follow up. He has history of chronic diastolic heart failure.   Lab Results  Component Value Date   CREATININE 1.20 08/14/2017   This had improved from 1.6 when checked in Nov.   He reports checking his BP at home, running mostly [id number] systolic, recently 952/84. He plans to exercise more when weather improves. He's been taking Lasix 20mg  QD. He denies lightheadedness or dizziness when standing up. When he was seen by his rheumatologist, his BP was slightly elevated. He had stopped taking Aleve in Feb because he was informed it could affect his kidneys.   Diabetes New diagnosis in Nov 2018 with A1C 7.3.  Initial diet control approach was decided. Glucose elevated at 172 last visit and  136 the previous visit. Plans on starting up more exercise as weather has warmed up.  Lab Results  Component Value Date   GLUCOSE 172 (H) 08/14/2017   GLUCOSE 136 (H) 05/19/2017    Patient Active Problem List   Diagnosis Date Noted  . Chronic diastolic heart failure ([place]) 06/07/2017  . ACUTE BRONCHITIS 08/11/2007  . HYPERLIPIDEMIA 09/27/2006  . ANEMIA-IRON DEFICIENCY 09/27/2006  . Essential hypertension 09/27/2006  . ALLERGIC RHINITIS 09/27/2006  . DIVERTICULOSIS, COLON 09/27/2006  . COLONIC POLYPS, HX OF 09/27/2006   Past Medical History:  Diagnosis Date  . Hypertension   . Kidney stone    History reviewed. No pertinent surgical history. Allergies  Allergen Reactions  . Lipitor [Atorvastatin Calcium] Other (See Comments)    Muscle pains  . Vibra-Tab [Doxycycline] Other (See Comments)    Muscle pain   . Z-Pak [Azithromycin]   . Erythromycin Rash   Prior to Admission medications   Medication Sig Start Date End Date Taking? Authorizing Provider  cetirizine (ZYRTEC) 10 MG tablet Take 10 mg by mouth daily. Reported on 07/07/2015   Yes [provider]  furosemide (LASIX) 20 MG tablet Take 1 tablet (20 mg total) by mouth daily. 05/04/17  Yes English, Colletta [place] D, PA  lisinopril (PRINIVIL,ZESTRIL) 40 MG tablet TAKE 1 TABLET(40 MG) BY MOUTH DAILY 05/02/17  Yes [name], MD  metoprolol succinate (TOPROL-XL) 25 MG 24 hr tablet Take 1 tablet (  25 mg total) by mouth daily. 05/02/17  Yes [name], MD  metoprolol succinate (TOPROL-XL) 50 MG 24 hr tablet Take 1 tablet (50 mg total) by mouth daily. Take with or immediately following a meal. 05/02/17  Yes [name], MD  sertraline (ZOLOFT) 25 MG tablet Take 1 tablet (25 mg total) by mouth daily. 08/14/17  Yes [name], MD  simvastatin (ZOCOR) 20 MG tablet Take 1 tablet (20 mg total) by mouth at bedtime. 05/02/17  Yes [name], MD  triamterene-hydrochlorothiazide (MAXZIDE-25) 37.5-25 MG tablet  Take 1 tablet by mouth daily. 05/02/17  Yes [name], MD   Social History   Socioeconomic History  . Marital status: Married    Spouse name: Not on file  . Number of children: Not on file  . Years of education: Not on file  . Highest education level: Not on file  Occupational History  . Not on file  Social Needs  . Financial resource strain: Not on file  . Food insecurity:    Worry: Not on file    Inability: Not on file  . Transportation needs:    Medical: Not on file    Non-medical: Not on file  Tobacco Use  . Smoking status: Former Research scientist (life sciences)  . Smokeless tobacco: Never Used  Substance and Sexual Activity  . Alcohol use: No  . Drug use: No  . Sexual activity: Not on file  Lifestyle  . Physical activity:    Days per week: Not on file    Minutes per session: Not on file  . Stress: Not on file  Relationships  . Social connections:    Talks on phone: Not on file    Gets together: Not on file    Attends religious service: Not on file    Active member of club or organization: Not on file    Attends meetings of clubs or organizations: Not on file    Relationship status: Not on file  . Intimate partner violence:    Fear of current or ex partner: Not on file    Emotionally abused: Not on file    Physically abused: Not on file    Forced sexual activity: Not on file  Other Topics Concern  . Not on file  Social History Narrative  . Not on file   Review of Systems  Constitutional: Negative for fatigue and unexpected weight change.  Eyes: Negative for visual disturbance.  Respiratory: Negative for cough, chest tightness and shortness of breath.   Cardiovascular: Negative for chest pain, palpitations and leg swelling.  Gastrointestinal: Negative for abdominal pain and Benjamin in stool.  Neurological: Negative for dizziness, light-headedness and headaches.       Objective:   Physical Exam  Constitutional: He is oriented to person, place, and time. He appears  well-developed and well-nourished.  HENT:  Head: Normocephalic and atraumatic.  Eyes: Pupils are equal, round, and reactive to light. EOM are normal.  Neck: No JVD present. Carotid bruit is not present.  Cardiovascular: Normal rate, regular rhythm and normal heart sounds.  No murmur heard. Pulmonary/Chest: Effort normal and breath sounds normal. He has no rales.  Musculoskeletal: He exhibits no edema (no appreciable pedal edema).  Neurological: He is alert and oriented to person, place, and time.  Skin: Skin is warm and dry.  Psychiatric: He has a normal mood and affect.  Vitals reviewed.   Vitals:   09/25/17 1019  BP: (!) 150/82  Pulse: 60  Resp:  18  Temp: 98.3 F (36.8 C)  TempSrc: Oral  SpO2: 98%  Weight: 214 lb 12.8 oz (97.4 kg)  Height: 5\' 5"  (1.651 m)       Assessment & Plan:   DANTAVIOUS SNOWBALL is a 67 y.o. male Type 2 diabetes mellitus without complication, without long-term current use of insulin ([place]) - Plan: Hemoglobin A1c  -Check A1c, if still elevated over 7 would consider medication at this point.  Activity with low intensity discussed initially such as walking.  Would slowly increase activity under the guidance of cardiology given history of chronic diastolic CHF and hypertension.  Anxiety state - Plan: sertraline (ZOLOFT) 25 MG tablet  -Stable/improved.  Continue Zoloft same dose  Essential hypertension - Plan: Basic metabolic panel, triamterene-hydrochlorothiazide (MAXZIDE-25) 37.5-25 MG tablet, metoprolol succinate (TOPROL-XL) 50 MG 24 hr tablet, metoprolol succinate (TOPROL-XL) 25 MG 24 hr tablet, lisinopril (PRINIVIL,ZESTRIL) 40 MG tablet  -Suspect whitecoat hypertension component as lower readings at home.  No change in regimen for now, could consider increasing beta-blocker but heart rate at 60 currently, risk of bradycardia/orthostasis.  Advised to let me know his home readings and if over 140/90 we will look at regimen further  Chronic diastolic heart  failure ([place]) - Plan: furosemide (LASIX) 20 MG tablet  -Stable, appears euvolemic, tolerating Lasix at current dose.  Check BMP to evaluate electro lites, no change in dosing for now, follow-up with cardiology as planned in the next few months  Hyperlipidemia, unspecified hyperlipidemia type - Plan: simvastatin (ZOCOR) 20 MG tablet  -Tolerating Zocor, continue same dose.  Not fasting at present, plan for fasting Benjamin work next visit  Meds ordered this encounter  Medications  . sertraline (ZOLOFT) 25 MG tablet    Sig: Take 1 tablet (25 mg total) by mouth daily.    Dispense:  90 tablet    Refill:  1  . triamterene-hydrochlorothiazide (MAXZIDE-25) 37.5-25 MG tablet    Sig: Take 1 tablet by mouth daily.    Dispense:  90 tablet    Refill:  1  . simvastatin (ZOCOR) 20 MG tablet    Sig: Take 1 tablet (20 mg total) by mouth at bedtime.    Dispense:  90 tablet    Refill:  1  . metoprolol succinate (TOPROL-XL) 50 MG 24 hr tablet    Sig: Take 1 tablet (50 mg total) by mouth daily. Take with or immediately following a meal.    Dispense:  90 tablet    Refill:  1  . metoprolol succinate (TOPROL-XL) 25 MG 24 hr tablet    Sig: Take 1 tablet (25 mg total) by mouth daily.    Dispense:  90 tablet    Refill:  1  . lisinopril (PRINIVIL,ZESTRIL) 40 MG tablet    Sig: TAKE 1 TABLET(40 MG) BY MOUTH DAILY    Dispense:  90 tablet    Refill:  1  . furosemide (LASIX) 20 MG tablet    Sig: Take 1 tablet (20 mg total) by mouth daily.    Dispense:  30 tablet    Refill:  1   Patient Instructions    If Benjamin pressure remains below 140/90, then ok to continue same dose of Benjamin pressure medicine. If it runs higher, let me know so we can changes your meds. Walking for exercise can help with Benjamin pressure and diabetes.   Recheck in 3 months - fasting for that visit so we can check your cholesterol.   No med changes for now. Thanks for  coming in today.   IF you received an x-Benjamin today, you will receive an  invoice from [facility]. Please contact [facility] at [phone] with questions or concerns regarding your invoice.   IF you received labwork today, you will receive an invoice from [facility]. Please contact [facility] at [phone] with questions or concerns regarding your invoice.   Our billing staff will not be able to assist you with questions regarding bills from these companies.  You will be contacted with the lab results as soon as they are available. The fastest way to get your results is to activate your My Chart account. Instructions are located on the last page of this paperwork. If you have not heard from [place] regarding the results in 2 weeks, please contact this office.      I personally performed the services described in this documentation, which was scribed in my presence. The recorded information has been reviewed and considered for accuracy and completeness, addended by me as needed, and agree with information above.  Signed,   Steven Ray, MD [facility] [place].  09/25/17 11:01 AM

## 2017-09-25 NOTE — Patient Instructions (Addendum)
  If blood pressure remains below 140/90, then ok to continue same dose of blood pressure medicine. If it runs higher, let me know so we can changes your meds. Walking for exercise can help with blood pressure and diabetes.   Recheck in 3 months - fasting for that visit so we can check your cholesterol.   No med changes for now. Thanks for coming in today.   IF you received an x-ray today, you will receive an invoice from [facility]. Please contact [facility] at [phone] with questions or concerns regarding your invoice.   IF you received labwork today, you will receive an invoice from [place]. Please contact [facility] at [phone] with questions or concerns regarding your invoice.   Our billing staff will not be able to assist you with questions regarding bills from these companies.  You will be contacted with the lab results as soon as they are available. The fastest way to get your results is to activate your My Chart account. Instructions are located on the last page of this paperwork. If you have not heard from [place] regarding the results in 2 weeks, please contact this office.

## 2017-09-26 LAB — BASIC METABOLIC PANEL
BUN / CREAT RATIO: 17 (ref 10–24)
BUN: 21 mg/dL (ref 8–27)
CO2: 25 mmol/L (ref 20–29)
CREATININE: 1.27 mg/dL (ref 0.76–1.27)
Calcium: 9.6 mg/dL (ref 8.6–10.2)
Chloride: 94 mmol/L — ABNORMAL LOW (ref 96–106)
GFR calc non Af Amer: 58 mL/min/{1.73_m2} — ABNORMAL LOW (ref >59)
GFR, EST AFRICAN AMERICAN: 67 mL/min/{1.73_m2} (ref >59)
Glucose: 118 mg/dL — ABNORMAL HIGH (ref 65–99)
Potassium: 4.2 mmol/L (ref 3.5–5.2)
Sodium: 139 mmol/L (ref 134–144)

## 2017-09-26 LAB — HEMOGLOBIN A1C
Est. average glucose Bld gHb Est-mCnc: 160 mg/dL
Hgb A1c MFr Bld: 7.2 % — ABNORMAL HIGH (ref 4.8–5.6)

## 2017-09-28 IMAGING — CR DG CHEST 2V
2 series · 2 of 2 positions shown · non-contrast
Comparison: None.

CLINICAL DATA: Shortness of breath.

EXAM:
CHEST  2 VIEW

[PA]
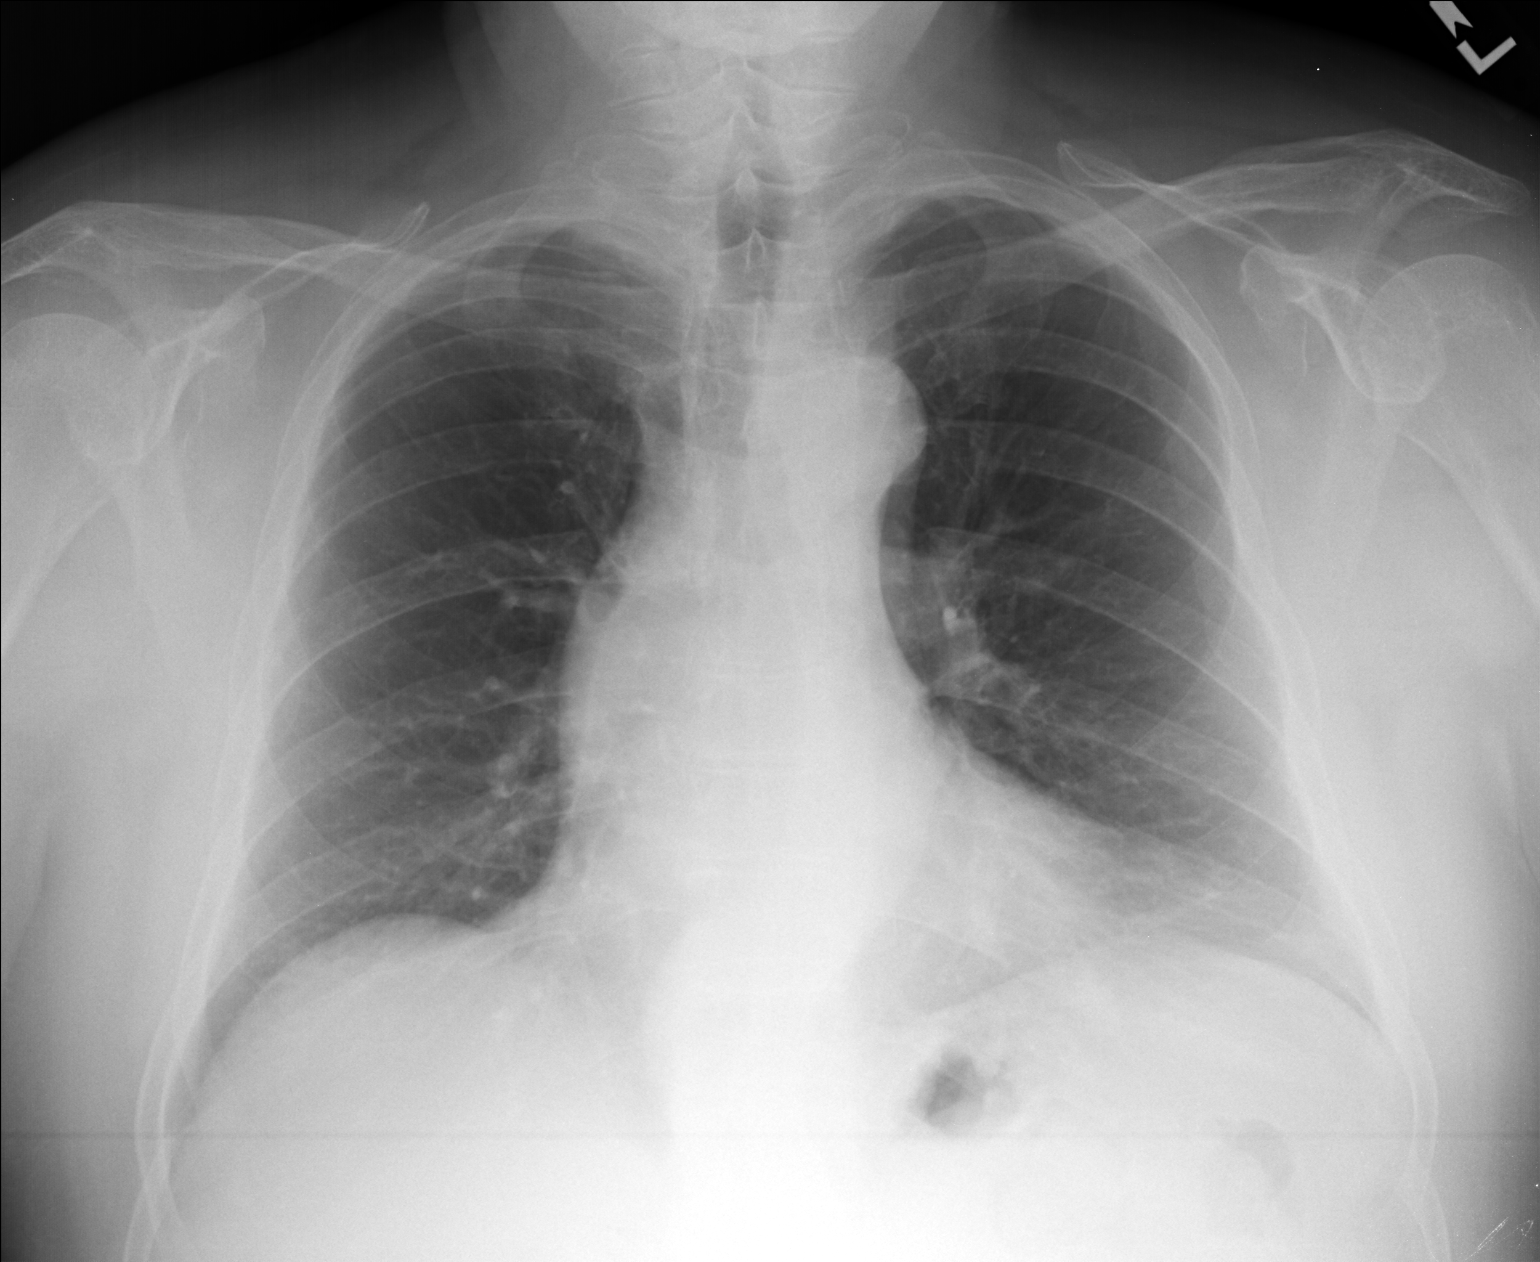

[lateral]
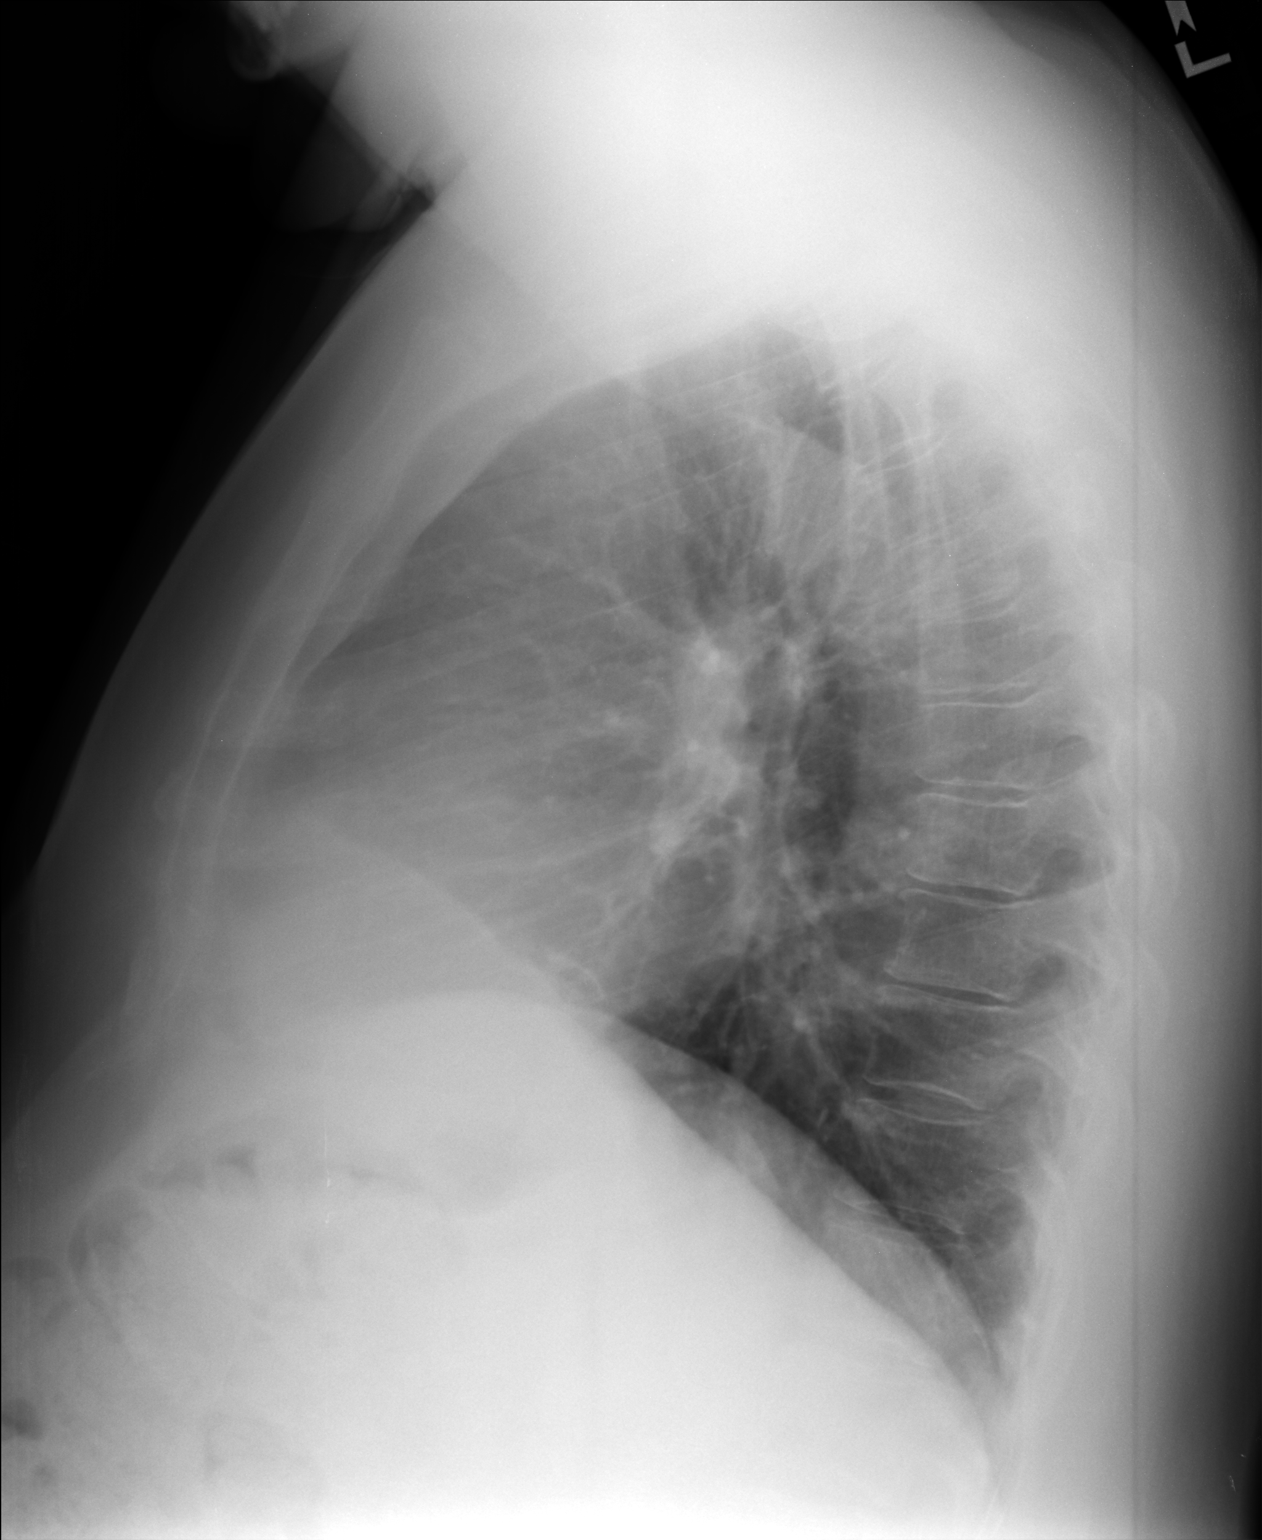

[2 of 2 positions shown; findings below may reference images not displayed]

FINDINGS: Cardiomediastinal silhouette is normal. Mediastinal contours appear
intact. The aorta is torturous.

There is no evidence of focal airspace consolidation, pleural
effusion or pneumothorax.

Osseous structures are without acute abnormality. Soft tissues are
grossly normal.
IMPRESSION: No active cardiopulmonary disease.

Extensive tortuosity of the aorta.

## 2017-10-11 ENCOUNTER — Encounter: Payer: Self-pay | Admitting: Physician Assistant

## 2017-11-13 IMAGING — CR DG ABDOMEN 1V
1 series · 1 of 1 positions shown · non-contrast
Comparison: None.

CLINICAL DATA: Flank region pain

EXAM:
ABDOMEN - 1 VIEW

[AP]
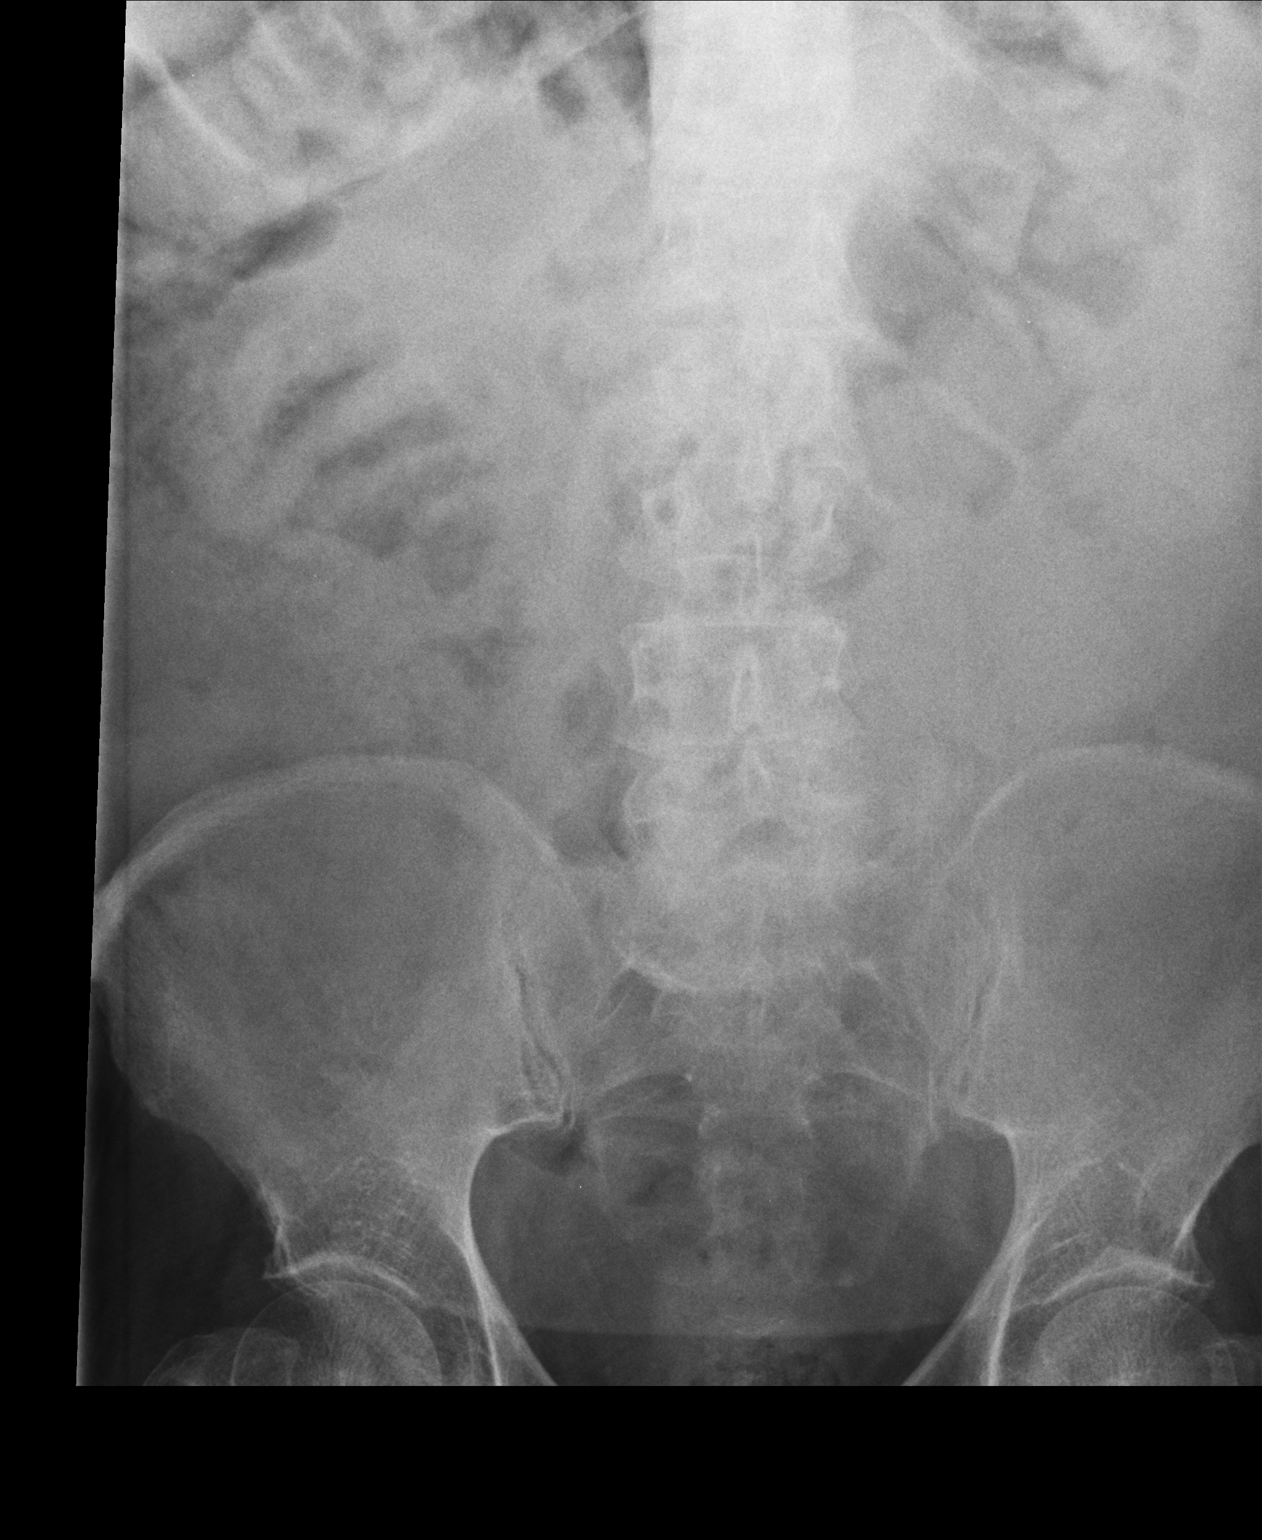

[1 of 1 positions shown; findings below may reference images not displayed]

FINDINGS: There is moderate stool throughout the colon. There is no bowel
dilatation or air-fluid level suggesting obstruction. No free air is
seen on this supine examination. Calcifications in the pelvis most
likely represent phleboliths. No other abnormal calcifications are
identified.
IMPRESSION: Probable phleboliths the pelvis. Moderate stool throughout colon.
Bowel gas pattern unremarkable.

## 2017-12-13 DIAGNOSIS — M1990 Unspecified osteoarthritis, unspecified site: Secondary | ICD-10-CM | POA: Diagnosis not present

## 2017-12-13 DIAGNOSIS — Z8739 Personal history of other diseases of the musculoskeletal system and connective tissue: Secondary | ICD-10-CM | POA: Diagnosis not present

## 2017-12-13 DIAGNOSIS — M069 Rheumatoid arthritis, unspecified: Secondary | ICD-10-CM | POA: Diagnosis not present

## 2017-12-13 DIAGNOSIS — M25431 Effusion, right wrist: Secondary | ICD-10-CM | POA: Diagnosis not present

## 2017-12-26 ENCOUNTER — Ambulatory Visit: Payer: Medicare Other | Admitting: Family Medicine

## 2018-01-01 ENCOUNTER — Ambulatory Visit (INDEPENDENT_AMBULATORY_CARE_PROVIDER_SITE_OTHER): Payer: Medicare Other | Admitting: Family Medicine

## 2018-01-01 ENCOUNTER — Other Ambulatory Visit: Payer: Self-pay

## 2018-01-01 ENCOUNTER — Encounter: Payer: Self-pay | Admitting: Family Medicine

## 2018-01-01 VITALS — BP 138/80 | HR 56 | Temp 97.5°F | Ht 65.0 in | Wt 211.4 lb

## 2018-01-01 DIAGNOSIS — E785 Hyperlipidemia, unspecified: Secondary | ICD-10-CM

## 2018-01-01 DIAGNOSIS — Z1159 Encounter for screening for other viral diseases: Secondary | ICD-10-CM | POA: Diagnosis not present

## 2018-01-01 DIAGNOSIS — Z23 Encounter for immunization: Secondary | ICD-10-CM | POA: Diagnosis not present

## 2018-01-01 DIAGNOSIS — I5032 Chronic diastolic (congestive) heart failure: Secondary | ICD-10-CM | POA: Diagnosis not present

## 2018-01-01 DIAGNOSIS — F411 Generalized anxiety disorder: Secondary | ICD-10-CM

## 2018-01-01 DIAGNOSIS — I10 Essential (primary) hypertension: Secondary | ICD-10-CM

## 2018-01-01 DIAGNOSIS — E119 Type 2 diabetes mellitus without complications: Secondary | ICD-10-CM

## 2018-01-01 MED ORDER — LISINOPRIL 40 MG PO TABS: ORAL_TABLET | ORAL | 1 refills | Status: DC

## 2018-01-01 MED ORDER — SERTRALINE HCL 25 MG PO TABS: 25.0000 mg | ORAL_TABLET | Freq: Every day | ORAL | 1 refills | Status: DC

## 2018-01-01 MED ORDER — FUROSEMIDE 20 MG PO TABS: 20.0000 mg | ORAL_TABLET | Freq: Every day | ORAL | 1 refills | Status: DC

## 2018-01-01 MED ORDER — TRIAMTERENE-HCTZ 37.5-25 MG PO TABS: 1.0000 | ORAL_TABLET | Freq: Every day | ORAL | 1 refills | Status: DC

## 2018-01-01 MED ORDER — METOPROLOL SUCCINATE ER 25 MG PO TB24: 25.0000 mg | ORAL_TABLET | Freq: Every day | ORAL | 1 refills | Status: DC

## 2018-01-01 MED ORDER — SIMVASTATIN 20 MG PO TABS: 20.0000 mg | ORAL_TABLET | Freq: Every day | ORAL | 1 refills | Status: DC

## 2018-01-01 NOTE — Progress Notes (Signed)
Subjective:    Patient ID: Steven Benjamin, male    DOB: 1950-09-26, 67 y.o.   MRN: [id number]  HPI Steven Benjamin is a 67 y.o. male Presents today for: Chief Complaint  Patient presents with  . Chronic Conditions    3 month f/u  . Hypertension    3 m  f/u   . Diabetes   Diabetes: Lab Results  Component Value Date   HGBA1C 7.2 (H) 09/25/2017  New diagnosis of November 2018 with initial A1c of 7.3.  Diet control approach planned, and plan for increased exercise when last seen in March.  Has lost 3 pounds since last visit.  Due for optho exam, foot exam, Pneumovax. Has been cutting back on portions, and walking for exercise few days per week depending on the weather and parking further out to walk.   Does not have optho visit yet Wt Readings from Last 3 Encounters:  01/01/18 211 lb 6.4 oz (95.9 kg)  09/25/17 214 lb 12.8 oz (97.4 kg)  08/28/17 214 lb 3.2 oz (97.2 kg)   Hypertension: BP Readings from Last 3 Encounters:  01/01/18 138/80  09/25/17 (!) 150/82  08/28/17 (!) 160/62   Lab Results  Component Value Date   CREATININE 1.27 09/25/2017  Component of whitecoat hypertension likely.  History of chronic diastolic heart failure followed by cardiology.  Was tolerating Lasix 20 mg daily at last visit, appeared euvolemic at that time.  Continued on lisinopril 40 mg daily, Toprol 75 mg daily, Maxide 37.5/25 mg daily.  Anxiety: Suspected situational anxiety with some long-term symptoms have improved with starting Zoloft and meeting with his pastor.  Continue Zoloft 25 mg daily at last visit.stable symptoms  Hyperlipidemia: Tolerating simvastatin at last visit, plan for fasting labs today. Lab Results  Component Value Date   CHOL 153 09/17/2011   HDL 63 09/17/2011   LDLCALC 79 09/17/2011   LDLDIRECT 140.5 06/19/2006   TRIG 57 09/17/2011   CHOLHDL 2.4 09/17/2011   Lab Results  Component Value Date   ALT 19 05/02/2017   AST 22 05/02/2017   ALKPHOS 77 05/02/2017   BILITOT 0.3  05/02/2017   Followed by [facility] for RA and gout, has appt coming up on the 9th. Now on allopurinol, leflunomide and prednisone.   Patient Active Problem List   Diagnosis Date Noted  . Chronic diastolic heart failure ([place]) 06/07/2017  . ACUTE BRONCHITIS 08/11/2007  . HYPERLIPIDEMIA 09/27/2006  . ANEMIA-IRON DEFICIENCY 09/27/2006  . Essential hypertension 09/27/2006  . ALLERGIC RHINITIS 09/27/2006  . DIVERTICULOSIS, COLON 09/27/2006  . COLONIC POLYPS, HX OF 09/27/2006   Past Medical History:  Diagnosis Date  . Hypertension   . Kidney stone    No past surgical history on file. Allergies  Allergen Reactions  . Lipitor [Atorvastatin Calcium] Other (See Comments)    Muscle pains  . Vibra-Tab [Doxycycline] Other (See Comments)    Muscle pain   . Z-Pak [Azithromycin]   . Erythromycin Rash   Prior to Admission medications   Medication Sig Start Date End Date Taking? Authorizing Provider  cetirizine (ZYRTEC) 10 MG tablet Take 10 mg by mouth daily. Reported on 07/07/2015   Yes [provider]  furosemide (LASIX) 20 MG tablet Take 1 tablet (20 mg total) by mouth daily. 09/25/17  Yes [name], MD  lisinopril (PRINIVIL,ZESTRIL) 40 MG tablet TAKE 1 TABLET(40 MG) BY MOUTH DAILY 09/25/17  Yes [name], MD  metoprolol succinate (TOPROL-XL) 25 MG 24 hr  tablet Take 1 tablet (25 mg total) by mouth daily. 09/25/17  Yes [name], MD  metoprolol succinate (TOPROL-XL) 50 MG 24 hr tablet Take 1 tablet (50 mg total) by mouth daily. Take with or immediately following a meal. 09/25/17  Yes [name], MD  sertraline (ZOLOFT) 25 MG tablet Take 1 tablet (25 mg total) by mouth daily. 09/25/17  Yes [name], MD  simvastatin (ZOCOR) 20 MG tablet Take 1 tablet (20 mg total) by mouth at bedtime. 09/25/17  Yes [name], MD  triamterene-hydrochlorothiazide (MAXZIDE-25) 37.5-25 MG tablet Take 1 tablet by mouth daily. 09/25/17  Yes [name], MD   Social History   Socioeconomic History  . Marital status: Married    Spouse name: Not on file  . Number of children: Not on file  . Years of education: Not on file  . Highest education level: Not on file  Occupational History  . Not on file  Social Needs  . Financial resource strain: Not on file  . Food insecurity:    Worry: Not on file    Inability: Not on file  . Transportation needs:    Medical: Not on file    Non-medical: Not on file  Tobacco Use  . Smoking status: Former Research scientist (life sciences)  . Smokeless tobacco: Never Used  Substance and Sexual Activity  . Alcohol use: No  . Drug use: No  . Sexual activity: Not on file  Lifestyle  . Physical activity:    Days per week: Not on file    Minutes per session: Not on file  . Stress: Not on file  Relationships  . Social connections:    Talks on phone: Not on file    Gets together: Not on file    Attends religious service: Not on file    Active member of club or organization: Not on file    Attends meetings of clubs or organizations: Not on file    Relationship status: Not on file  . Intimate partner violence:    Fear of current or ex partner: Not on file    Emotionally abused: Not on file    Physically abused: Not on file    Forced sexual activity: Not on file  Other Topics Concern  . Not on file  Social History Narrative  . Not on file    Review of Systems  Constitutional: Negative for fatigue and unexpected weight change.  Eyes: Negative for visual disturbance.  Respiratory: Negative for cough, chest tightness and shortness of breath.   Cardiovascular: Negative for chest pain, palpitations and leg swelling.  Gastrointestinal: Negative for abdominal pain and blood in stool.  Neurological: Negative for dizziness, light-headedness and headaches.       Objective:   Physical Exam  Constitutional: He is oriented to person, place, and time. He appears well-developed and well-nourished.  HENT:  Head:  Normocephalic and atraumatic.  Eyes: Pupils are equal, round, and reactive to light. EOM are normal.  Neck: No JVD present. Carotid bruit is not present.  Cardiovascular: Normal rate, regular rhythm and normal heart sounds.  No murmur heard. Pulmonary/Chest: Effort normal and breath sounds normal. He has no rales.  Musculoskeletal: He exhibits edema (1+ pitting to lower 1/3 of ankles. ).  Neurological: He is alert and oriented to person, place, and time.  Skin: Skin is warm and dry.  Psychiatric: He has a normal mood and affect.  Vitals reviewed.  Vitals:   01/01/18 0910 01/01/18 0913  BP: (!) 167/86 138/80  Pulse: (!) 56   Temp: (!) 97.5 F (36.4 C)   TempSrc: Oral   SpO2: 98%   Weight: 211 lb 6.4 oz (95.9 kg)   Height: _0  (1.651 m)         Assessment & Plan:    Steven Benjamin is a 67 y.o. male Type 2 diabetes mellitus without complication, without long-term current use of insulin ([place]) - Plan: Hemoglobin A1c  - check A1c. Now on prednisone. May need meds at this point. Labs pending.   Hyperlipidemia, unspecified hyperlipidemia type - Plan: Comprehensive metabolic panel, Lipid panel, simvastatin (ZOCOR) 20 MG tablet  -tolerating Zocor, continue same dose, labs pending  Essential hypertension - Plan: Comprehensive metabolic panel, triamterene-hydrochlorothiazide (MAXZIDE-25) 37.5-25 MG tablet, metoprolol succinate (TOPROL-XL) 25 MG 24 hr tablet, lisinopril (PRINIVIL,ZESTRIL) 40 MG tablet  - stable, check labs, continue same regimen for now.   Encounter for hepatitis C screening test for low risk patient - Plan: Hepatitis C antibody  Need for prophylactic vaccination against Streptococcus pneumoniae (pneumococcus) - Plan: Pneumococcal conjugate vaccine 13-valent IM  - pneumovax given with hx of DM.   Anxiety state - Plan: sertraline (ZOLOFT) 25 MG tablet  - stable. Continue same dose Zoloft as tolerating.   Chronic diastolic heart failure ([place]) - Plan: furosemide  (LASIX) 20 MG tablet  - advised to schedule follow up with cardiology. Continue lasix same dose.   Meds ordered this encounter  Medications  . simvastatin (ZOCOR) 20 MG tablet    Sig: Take 1 tablet (20 mg total) by mouth at bedtime.    Dispense:  90 tablet    Refill:  1  . triamterene-hydrochlorothiazide (MAXZIDE-25) 37.5-25 MG tablet    Sig: Take 1 tablet by mouth daily.    Dispense:  90 tablet    Refill:  1  . sertraline (ZOLOFT) 25 MG tablet    Sig: Take 1 tablet (25 mg total) by mouth daily.    Dispense:  90 tablet    Refill:  1  . metoprolol succinate (TOPROL-XL) 25 MG 24 hr tablet    Sig: Take 1 tablet (25 mg total) by mouth daily.    Dispense:  90 tablet    Refill:  1  . lisinopril (PRINIVIL,ZESTRIL) 40 MG tablet    Sig: TAKE 1 TABLET(40 MG) BY MOUTH DAILY    Dispense:  90 tablet    Refill:  1  . furosemide (LASIX) 20 MG tablet    Sig: Take 1 tablet (20 mg total) by mouth daily.    Dispense:  90 tablet    Refill:  1   Patient Instructions    I will check diabetes test, but you may need to be on meds if that is higher - especially with use of prednisone.   Pneumonia vaccine given today.   PLease call and schedule eye doctor appointment.   It appears you are due for cardiology follow up - please call Dr. Kennon Holter office to schedule appointment with a provider.    Type 2 Diabetes Mellitus, Self Care, Adult When you have type 2 diabetes (type 2 diabetes mellitus), you must keep your blood sugar (glucose) under control. You can do this with:  Nutrition.  Exercise.  Lifestyle changes.  Medicines or insulin, if needed.  Support from your doctors and others.  How do I manage my blood sugar?  Check your blood sugar level every day, as often as told.  Call your doctor if your blood sugar  is above your goal numbers for 2 tests in a row.  Have your A1c (hemoglobin A1c) level checked at least two times a year. Have it checked more often if your doctor tells you  to. Your doctor will set treatment goals for you. Generally, you should have these blood sugar levels:  Before meals (preprandial): 80-130 mg/dL (4.4-7.2 mmol/L).  After meals (postprandial): lower than 180 mg/dL (10 mmol/L).  A1c level: less than 7%.  What do I need to know about high blood sugar? High blood sugar is called hyperglycemia. Know the signs of high blood sugar. Signs may include:  Feeling: ? Thirsty. ? Hungry. ? Very tired.  Needing to pee (urinate) more than usual.  Blurry vision.  What do I need to know about low blood sugar? Low blood sugar is called hypoglycemia. This is when blood sugar is at or below 70 mg/dL (3.9 mmol/L). Symptoms may include:  Feeling: ? Hungry. ? Worried or nervous (anxious). ? Sweaty and clammy. ? Confused. ? Dizzy. ? Sleepy. ? Sick to your stomach (nauseous).  Having: ? A fast heartbeat (palpitations). ? A headache. ? A change in your vision. ? Jerky movements that you cannot control (seizure). ? Nightmares. ? Tingling or no feeling (numbness) around the mouth, lips, or tongue.  Having trouble with: ? Talking. ? Paying attention (concentrating). ? Moving (coordination). ? Sleeping.  Shaking.  Passing out (fainting).  Getting upset easily (irritability).  Treating low blood sugar  To treat low blood sugar, eat or drink something sugary right away. If you can think clearly and swallow safely, follow the 15:15 rule:  Take 15 grams of a fast-acting carb (carbohydrate). Some fast-acting carbs are: ? 1 tube of glucose gel. ? 3 sugar tablets (glucose pills). ? 6-8 pieces of hard candy. ? 4 oz (120 mL) of fruit juice. ? 4 oz (120 mL) regular (not diet) soda.  Check your blood sugar 15 minutes after you take the carb.  If your blood sugar is still at or below 70 mg/dL (3.9 mmol/L), take 15 grams of a carb again.  If your blood sugar does not go above 70 mg/dL (3.9 mmol/L) after 3 tries, get help right away.  After  your blood sugar goes back to normal, eat a meal or a snack within 1 hour.  Treating very low blood sugar If your blood sugar is at or below 54 mg/dL (3 mmol/L), you have very low blood sugar (severe hypoglycemia). This is an emergency. Do not wait to see if the symptoms will go away. Get medical help right away. Call your local emergency services (911 in the [place] Do not drive yourself to the hospital. If you have very low blood sugar and you cannot eat or drink, you may need a glucagon shot (injection). A family member or friend should learn how to check your blood sugar and how to give you a glucagon shot. Ask your doctor if you need to have a glucagon shot Steven at home. What else is important to manage my diabetes? Medicine Follow these instructions about insulin and diabetes medicines:  Take them as told by your doctor.  Adjust them as told by your doctor.  Do not run out of them.  Having diabetes can raise your risk for other long-term conditions. These include heart or kidney disease. Your doctor may prescribe medicines to help prevent problems from diabetes. Food   Make healthy food choices. These include: ? Chicken, fish, egg whites, and beans. ? Oats, whole  wheat, bulgur, brown rice, quinoa, and millet. ? Fresh fruits and vegetables. ? Low-fat dairy products. ? Nuts, avocado, olive oil, and canola oil.  Make a food plan with a specialist (dietitian).  Follow instructions from your doctor about what you cannot eat or drink.  Drink enough fluid to keep your pee (urine) clear or pale yellow.  Eat healthy snacks between healthy meals.  Keep track of carbs that you eat. Read food labels. Learn food serving sizes.  Follow your sick day plan when you cannot eat or drink normally. Make this plan with your doctor so it is ready to use. Activity  Exercise at least 3 times a week.  Do not go more than 2 days without exercising.  Talk with your doctor before you start a new  exercise. Your doctor may need to adjust your insulin, medicines, or food. Lifestyle   Do not use any tobacco products. These include cigarettes, chewing tobacco, and e-cigarettes.If you need help quitting, ask your doctor.  Ask your doctor how much alcohol is safe for you.  Learn to deal with stress. If you need help with this, ask your doctor. Body care  Stay up to date with your shots (immunizations).  Have your eyes and feet checked by a doctor as often as told.  Check your skin and feet every day. Check for cuts, bruises, redness, blisters, or sores.  Brush your teeth and gums two times a day.  Floss at least one time a day.  Go to the dentist least one time every 6 months.  Stay at a healthy weight. General instructions   Take over-the-counter and prescription medicines only as told by your doctor.  Share your diabetes care plan with: ? Your work or school. ? People you live with.  Check your pee (urine) for ketones: ? When you are sick. ? As told by your doctor.  Carry a card or wear jewelry that says that you have diabetes.  Ask your doctor: ? Do I need to meet with a diabetes educator? ? Where can I find a support group for people with diabetes?  Keep all follow-up visits as told by your doctor. This is important. Where to find more information: To learn more about diabetes, visit:  American Diabetes Association: www.diabetes.org  American Association of Diabetes Educators: www.diabeteseducator.org/patient-resources  This information is not intended to replace advice given to you by your health care provider. Make sure you discuss any questions you have with your health care provider. Document Released: 10/12/2015 Document Revised: 11/26/2015 Document Reviewed: 07/24/2015 Elsevier Interactive Patient Education  2018 [employer].   IF you received an x-ray today, you will receive an invoice from [facility]. Please contact [facility]  Radiology at [phone] with questions or concerns regarding your invoice.   IF you received labwork today, you will receive an invoice from [facility]. Please contact [facility] at [phone] with questions or concerns regarding your invoice.   Our billing staff will not be able to assist you with questions regarding bills from these companies.  You will be contacted with the lab results as soon as they are available. The fastest way to get your results is to activate your My Chart account. Instructions are located on the last page of this paperwork. If you have not heard from [place] regarding the results in 2 weeks, please contact this office.       Signed,   [name], MD [facility].  01/01/18 10:06 AM

## 2018-01-01 NOTE — Patient Instructions (Addendum)
I will check diabetes test, but you may need to be on meds if that is higher - especially with use of prednisone.   Pneumonia vaccine given today.   PLease call and schedule eye doctor appointment.   It appears you are due for cardiology follow up - please call Dr. Kennon Holter office to schedule appointment with a provider.    Type 2 Diabetes Mellitus, Self Care, Adult When you have type 2 diabetes (type 2 diabetes mellitus), you must keep your blood sugar (glucose) under control. You can do this with:  Nutrition.  Exercise.  Lifestyle changes.  Medicines or insulin, if needed.  Support from your doctors and others.  How do I manage my blood sugar?  Check your blood sugar level every day, as often as told.  Call your doctor if your blood sugar is above your goal numbers for 2 tests in a row.  Have your A1c (hemoglobin A1c) level checked at least two times a year. Have it checked more often if your doctor tells you to. Your doctor will set treatment goals for you. Generally, you should have these blood sugar levels:  Before meals (preprandial): 80-130 mg/dL (4.4-7.2 mmol/L).  After meals (postprandial): lower than 180 mg/dL (10 mmol/L).  A1c level: less than 7%.  What do I need to know about high blood sugar? High blood sugar is called hyperglycemia. Know the signs of high blood sugar. Signs may include:  Feeling: ? Thirsty. ? Hungry. ? Very tired.  Needing to pee (urinate) more than usual.  Blurry vision.  What do I need to know about low blood sugar? Low blood sugar is called hypoglycemia. This is when blood sugar is at or below 70 mg/dL (3.9 mmol/L). Symptoms may include:  Feeling: ? Hungry. ? Worried or nervous (anxious). ? Sweaty and clammy. ? Confused. ? Dizzy. ? Sleepy. ? Sick to your stomach (nauseous).  Having: ? A fast heartbeat (palpitations). ? A headache. ? A change in your vision. ? Jerky movements that you cannot control  (seizure). ? Nightmares. ? Tingling or no feeling (numbness) around the mouth, lips, or tongue.  Having trouble with: ? Talking. ? Paying attention (concentrating). ? Moving (coordination). ? Sleeping.  Shaking.  Passing out (fainting).  Getting upset easily (irritability).  Treating low blood sugar  To treat low blood sugar, eat or drink something sugary right away. If you can think clearly and swallow safely, follow the 15:15 rule:  Take 15 grams of a fast-acting carb (carbohydrate). Some fast-acting carbs are: ? 1 tube of glucose gel. ? 3 sugar tablets (glucose pills). ? 6-8 pieces of hard candy. ? 4 oz (120 mL) of fruit juice. ? 4 oz (120 mL) regular (not diet) soda.  Check your blood sugar 15 minutes after you take the carb.  If your blood sugar is still at or below 70 mg/dL (3.9 mmol/L), take 15 grams of a carb again.  If your blood sugar does not go above 70 mg/dL (3.9 mmol/L) after 3 tries, get help right away.  After your blood sugar goes back to normal, eat a meal or a snack within 1 hour.  Treating very low blood sugar If your blood sugar is at or below 54 mg/dL (3 mmol/L), you have very low blood sugar (severe hypoglycemia). This is an emergency. Do not wait to see if the symptoms will go away. Get medical help right away. Call your local emergency services (911 in the [place] Do not drive yourself to the hospital.  If you have very low blood sugar and you cannot eat or drink, you may need a glucagon shot (injection). A family member or friend should learn how to check your blood sugar and how to give you a glucagon shot. Ask your doctor if you need to have a glucagon shot kit at home. What else is important to manage my diabetes? Medicine Follow these instructions about insulin and diabetes medicines:  Take them as told by your doctor.  Adjust them as told by your doctor.  Do not run out of them.  Having diabetes can raise your risk for other long-term  conditions. These include heart or kidney disease. Your doctor may prescribe medicines to help prevent problems from diabetes. Food   Make healthy food choices. These include: ? Chicken, fish, egg whites, and beans. ? Oats, whole wheat, bulgur, brown rice, quinoa, and millet. ? Fresh fruits and vegetables. ? Low-fat dairy products. ? Nuts, avocado, olive oil, and canola oil.  Make a food plan with a specialist (dietitian).  Follow instructions from your doctor about what you cannot eat or drink.  Drink enough fluid to keep your pee (urine) clear or pale yellow.  Eat healthy snacks between healthy meals.  Keep track of carbs that you eat. Read food labels. Learn food serving sizes.  Follow your sick day plan when you cannot eat or drink normally. Make this plan with your doctor so it is ready to use. Activity  Exercise at least 3 times a week.  Do not go more than 2 days without exercising.  Talk with your doctor before you start a new exercise. Your doctor may need to adjust your insulin, medicines, or food. Lifestyle   Do not use any tobacco products. These include cigarettes, chewing tobacco, and e-cigarettes.If you need help quitting, ask your doctor.  Ask your doctor how much alcohol is safe for you.  Learn to deal with stress. If you need help with this, ask your doctor. Body care  Stay up to date with your shots (immunizations).  Have your eyes and feet checked by a doctor as often as told.  Check your skin and feet every day. Check for cuts, bruises, redness, blisters, or sores.  Brush your teeth and gums two times a day.  Floss at least one time a day.  Go to the dentist least one time every 6 months.  Stay at a healthy weight. General instructions   Take over-the-counter and prescription medicines only as told by your doctor.  Share your diabetes care plan with: ? Your work or school. ? People you live with.  Check your pee (urine) for  ketones: ? When you are sick. ? As told by your doctor.  Carry a card or wear jewelry that says that you have diabetes.  Ask your doctor: ? Do I need to meet with a diabetes educator? ? Where can I find a support group for people with diabetes?  Keep all follow-up visits as told by your doctor. This is important. Where to find more information: To learn more about diabetes, visit:  American Diabetes Association: www.diabetes.org  American Association of Diabetes Educators: www.diabeteseducator.org/patient-resources  This information is not intended to replace advice given to you by your health care provider. Make sure you discuss any questions you have with your health care provider. Document Released: 10/12/2015 Document Revised: 11/26/2015 Document Reviewed: 07/24/2015 Elsevier Interactive Patient Education  2018 [employer].   IF you received an x-ray today, you will receive an  invoice from [facility]iology. Please contact [facility] at [phone] with questions or concerns regarding your invoice.   IF you received labwork today, you will receive an invoice from [facility]. Please contact [facility] at [phone] with questions or concerns regarding your invoice.   Our billing staff will not be able to assist you with questions regarding bills from these companies.  You will be contacted with the lab results as soon as they are available. The fastest way to get your results is to activate your My Chart account. Instructions are located on the last page of this paperwork. If you have not heard from [place] regarding the results in 2 weeks, please contact this office.

## 2018-01-02 LAB — COMPREHENSIVE METABOLIC PANEL WITH GFR
ALT: 26 IU/L (ref 0–44)
AST: 26 IU/L (ref 0–40)
Albumin/Globulin Ratio: 1.4 (ref 1.2–2.2)
Albumin: 4.1 g/dL (ref 3.6–4.8)
Alkaline Phosphatase: 64 IU/L (ref 39–117)
BUN/Creatinine Ratio: 19 (ref 10–24)
BUN: 25 mg/dL (ref 8–27)
Bilirubin Total: 0.6 mg/dL (ref 0.0–1.2)
CO2: 26 mmol/L (ref 20–29)
Calcium: 9.6 mg/dL (ref 8.6–10.2)
Chloride: 98 mmol/L (ref 96–106)
Creatinine, Ser: 1.35 mg/dL — ABNORMAL HIGH (ref 0.76–1.27)
GFR calc Af Amer: 62 mL/min/1.73 (ref >59)
GFR calc non Af Amer: 54 mL/min/1.73 — ABNORMAL LOW (ref >59)
Globulin, Total: 2.9 g/dL (ref 1.5–4.5)
Glucose: 111 mg/dL — ABNORMAL HIGH (ref 65–99)
Potassium: 3.9 mmol/L (ref 3.5–5.2)
Sodium: 140 mmol/L (ref 134–144)
Total Protein: 7 g/dL (ref 6.0–8.5)

## 2018-01-02 LAB — HEMOGLOBIN A1C
ESTIMATED AVERAGE GLUCOSE: 166 mg/dL
HEMOGLOBIN A1C: 7.4 % — AB (ref 4.8–5.6)

## 2018-01-02 LAB — HEPATITIS C ANTIBODY

## 2018-01-02 LAB — LIPID PANEL
CHOL/HDL RATIO: 3.2 ratio (ref 0.0–5.0)
Cholesterol, Total: 194 mg/dL (ref 100–199)
HDL: 60 mg/dL (ref >39)
LDL Calculated: 89 mg/dL (ref 0–99)
TRIGLYCERIDES: 223 mg/dL — AB (ref 0–149)
VLDL Cholesterol Cal: 45 mg/dL — ABNORMAL HIGH (ref 5–40)

## 2018-01-09 DIAGNOSIS — M1990 Unspecified osteoarthritis, unspecified site: Secondary | ICD-10-CM | POA: Diagnosis not present

## 2018-01-09 DIAGNOSIS — Z79899 Other long term (current) drug therapy: Secondary | ICD-10-CM | POA: Diagnosis not present

## 2018-01-09 DIAGNOSIS — M069 Rheumatoid arthritis, unspecified: Secondary | ICD-10-CM | POA: Diagnosis not present

## 2018-01-09 DIAGNOSIS — Z8739 Personal history of other diseases of the musculoskeletal system and connective tissue: Secondary | ICD-10-CM | POA: Diagnosis not present

## 2018-03-12 DIAGNOSIS — Z8739 Personal history of other diseases of the musculoskeletal system and connective tissue: Secondary | ICD-10-CM | POA: Diagnosis not present

## 2018-03-12 DIAGNOSIS — M069 Rheumatoid arthritis, unspecified: Secondary | ICD-10-CM | POA: Diagnosis not present

## 2018-03-12 DIAGNOSIS — M1990 Unspecified osteoarthritis, unspecified site: Secondary | ICD-10-CM | POA: Diagnosis not present

## 2018-03-12 DIAGNOSIS — Z79899 Other long term (current) drug therapy: Secondary | ICD-10-CM | POA: Diagnosis not present

## 2018-04-03 ENCOUNTER — Encounter: Payer: Self-pay | Admitting: Family Medicine

## 2018-04-03 ENCOUNTER — Ambulatory Visit (INDEPENDENT_AMBULATORY_CARE_PROVIDER_SITE_OTHER): Payer: Medicare Other | Admitting: Family Medicine

## 2018-04-03 VITALS — BP 150/108 | HR 87 | Temp 98.3°F | Ht 67.0 in | Wt 206.8 lb

## 2018-04-03 DIAGNOSIS — I10 Essential (primary) hypertension: Secondary | ICD-10-CM | POA: Diagnosis not present

## 2018-04-03 DIAGNOSIS — E785 Hyperlipidemia, unspecified: Secondary | ICD-10-CM | POA: Diagnosis not present

## 2018-04-03 DIAGNOSIS — I5032 Chronic diastolic (congestive) heart failure: Secondary | ICD-10-CM

## 2018-04-03 DIAGNOSIS — R350 Frequency of micturition: Secondary | ICD-10-CM | POA: Diagnosis not present

## 2018-04-03 DIAGNOSIS — E1165 Type 2 diabetes mellitus with hyperglycemia: Secondary | ICD-10-CM

## 2018-04-03 DIAGNOSIS — Z23 Encounter for immunization: Secondary | ICD-10-CM

## 2018-04-03 DIAGNOSIS — R351 Nocturia: Secondary | ICD-10-CM

## 2018-04-03 DIAGNOSIS — F411 Generalized anxiety disorder: Secondary | ICD-10-CM | POA: Diagnosis not present

## 2018-04-03 LAB — POCT URINALYSIS DIP (MANUAL ENTRY)
Bilirubin, UA: NEGATIVE
Glucose, UA: 500 mg/dL — AB
LEUKOCYTES UA: NEGATIVE
NITRITE UA: NEGATIVE
Spec Grav, UA: 1.015 (ref 1.010–1.025)
Urobilinogen, UA: 0.2 E.U./dL
pH, UA: 5.5 (ref 5.0–8.0)

## 2018-04-03 LAB — POCT GLYCOSYLATED HEMOGLOBIN (HGB A1C): HEMOGLOBIN A1C: 11.9 % — AB (ref 4.0–5.6)

## 2018-04-03 LAB — BASIC METABOLIC PANEL
BUN/Creatinine Ratio: 17 (ref 10–24)
BUN: 20 mg/dL (ref 8–27)
CALCIUM: 9.7 mg/dL (ref 8.6–10.2)
CO2: 24 mmol/L (ref 20–29)
CREATININE: 1.19 mg/dL (ref 0.76–1.27)
Chloride: 97 mmol/L (ref 96–106)
GFR calc Af Amer: 72 mL/min/{1.73_m2} (ref >59)
GFR calc non Af Amer: 62 mL/min/{1.73_m2} (ref >59)
GLUCOSE: 449 mg/dL — AB (ref 65–99)
Potassium: 4.5 mmol/L (ref 3.5–5.2)
Sodium: 139 mmol/L (ref 134–144)

## 2018-04-03 LAB — POC MICROSCOPIC URINALYSIS (UMFC): Mucus: ABSENT

## 2018-04-03 LAB — GLUCOSE, POCT (MANUAL RESULT ENTRY): POC Glucose: >444 mg/dl (ref 70–99)

## 2018-04-03 MED ORDER — LISINOPRIL 40 MG PO TABS: ORAL_TABLET | ORAL | 1 refills | Status: AC

## 2018-04-03 MED ORDER — INSULIN GLARGINE 100 UNIT/ML SOLOSTAR PEN: 10.0000 [IU] | PEN_INJECTOR | Freq: Every day | SUBCUTANEOUS | 1 refills | Status: DC

## 2018-04-03 MED ORDER — METFORMIN HCL 500 MG PO TABS: 500.0000 mg | ORAL_TABLET | Freq: Two times a day (BID) | ORAL | 1 refills | Status: DC

## 2018-04-03 MED ORDER — FUROSEMIDE 20 MG PO TABS: 20.0000 mg | ORAL_TABLET | Freq: Every day | ORAL | 0 refills | Status: DC | PRN

## 2018-04-03 MED ORDER — TRIAMTERENE-HCTZ 37.5-25 MG PO TABS: 1.0000 | ORAL_TABLET | Freq: Every day | ORAL | 1 refills | Status: DC

## 2018-04-03 MED ORDER — SIMVASTATIN 20 MG PO TABS: 20.0000 mg | ORAL_TABLET | Freq: Every day | ORAL | 1 refills | Status: AC

## 2018-04-03 MED ORDER — METOPROLOL SUCCINATE ER 100 MG PO TB24: 100.0000 mg | ORAL_TABLET | Freq: Every day | ORAL | 3 refills | Status: AC

## 2018-04-03 MED ORDER — SERTRALINE HCL 25 MG PO TABS: 25.0000 mg | ORAL_TABLET | Freq: Every day | ORAL | 1 refills | Status: DC

## 2018-04-03 NOTE — Progress Notes (Signed)
Steven Benjamin   

## 2018-04-03 NOTE — Patient Instructions (Addendum)
If you have lab work done today you will be contacted with your lab results within the next 2 weeks.  If you have not heard from [place] then please contact us. The fastest way to get your results is to register for My Chart.  Call cardiologist to schedule follow up appointment. Blood pressure too high - increased toprol to 151m per day. Watch for lightheadedness or dizziness at that dose.   Unfortunately the blood sugar is very high today.  That is likely the reason for the frequent urination.  Start metformin twice per day, but we will also need to start insulin to get that to a safer level for now.  Please bring the insulin back to the office today and we will show you how to use that.  There is more information below.  Recheck with me in 3 days, but depending on numbers tonight I may have you come back sooner.  If you have any nausea, vomiting, abdominal pain, dizziness, headache, or any new symptoms proceed to the emergency room   Insulin Injection Instructions, Using Insulin Pens, Adult A subcutaneous injection is a shot of medicine that is injected into the layer of fat between skin and muscle. People with type 1 diabetes must take insulin because their bodies do not make it. People with type 2 diabetes may need to take insulin. There are many different types of insulin. The type of insulin that you take may determine how many injections you give yourself and when you need to take the injections. Choosing a site for injection Insulin absorption varies from site to site. It is best to inject insulin within the same body area, using a different spot in that area for each injection. Do not inject the insulin in the same spot for each injection. There are five main areas that can be used for injecting. These areas include:  Abdomen. This is the preferred area.  Front of thigh.  Upper, outer side of thigh.  Back of upper arm.  Buttocks.  Using an insulin pen First, follow the steps for Getting  Ready, then continue with the steps for Injecting the Insulin. Getting Ready 1. Wash your hands with soap and water. If soap and water are not available, use hand sanitizer. 2. Check the expiration date and type of insulin in the pen. 3. If you are using CLEAR insulin, check to see that it is clear and free of clumps. 4. If you are using CLOUDY insulin, gently roll the pen between your palms several times, or tip the pen up and down several times to mix up the medicine. Do not shake the pen. 5. Remove the cap from the insulin pen. 6. Use an alcohol wipe to clean the rubber stopper of the pen cartridge. 7. Remove the protective paper tab from the disposable needle. Do not let the needle touch anything. 8. Screw the needle onto the pen. 9. Remove the outer and inner plastic covers from the needle. Do not throw away the outer plastic cover yet. 10. Prime the insulin pen by turning the button (dial) to 2 units. Hold the pen with the needle pointing up, and push the button on the opposite end of the pen until a drop of insulin appears at the needle tip. If no insulin appears, repeat this step. 11. Dial the number of units of insulin that you will be injecting. Injecting the Insulin  1. Use an alcohol wipe to clean the site where you will be injecting the  needle. Let the site air-dry. 2. Hold the pen in the palm of your writing hand with your thumb on the top. 3. If directed by your health care provider, use your other hand to pinch and hold about an inch of skin at the injection site. Do not directly touch the cleaned part of the skin. 4. Gently but quickly, put the needle straight into the skin. The needle should be at a 90-degree angle (perpendicular) to the skin, as if to form the letter "L." ? For example, if you are giving an injection in the abdomen, the abdomen forms one "leg" of the "L" and the needle forms the other "leg" of the "L." 5. For adults who have a small amount of body fat, the  needle may need to be injected at a 45-degree angle instead. Your health care provider will tell you if this is necessary. ? A 45-degree angle looks like the letter "V." 6. When the needle is completely inserted into the skin, use the thumb of your writing hand to push the top button of the pen down all the way to inject the insulin. 7. Let go of the skin that you are pinching. Continue to hold the pen in place with your writing hand. 8. Wait five seconds, then pull the needle straight out of the skin. 9. Carefully put the larger (outer) plastic cover of the needle back over the needle, then unscrew the capped needle and discard it in a sharps container, such as an empty plastic bottle with a cover. 10. Put the plastic cap back on the insulin pen. Throwing away supplies  Discard all used needles in a puncture-proof sharps disposal container. You can ask your local pharmacy about where you can get this kind of disposal container, or you can use an empty liquid laundry detergent bottle that has a cover.  Follow the disposal regulations for the area where you live. Do not use any needle more than one time.  Throw away empty disposable pens in the regular trash. What questions should I ask my health care provider?  How often should I be taking insulin?  How often should I check my blood glucose?  What amount of insulin should I be taking at each time?  What are the side effects?  What should I do if my blood glucose is too high?  What should I do if my blood glucose is too low?  What should I do if I forget to take my insulin?  What number should I call if I have questions? Where can I get more information?  American Diabetes Association (ADA): www.diabetes.org  American Association of Diabetes Educators (AADE) Patient Resources: https://www.diabeteseducator.org/patient-resources This information is not intended to replace advice given to you by your health care provider. Make sure  you discuss any questions you have with your health care provider. Document Released: 07/24/2015 Document Revised: 11/26/2015 Document Reviewed: 07/24/2015 Elsevier Interactive Patient Education  2018 [employer].  Type 2 Diabetes Mellitus, Self Care, Adult When you have type 2 diabetes (type 2 diabetes mellitus), you must keep your blood sugar (glucose) under control. You can do this with:  Nutrition.  Exercise.  Lifestyle changes.  Medicines or insulin, if needed.  Support from your doctors and others.  How do I manage my blood sugar?  Check your blood sugar level every day, as often as told.  Call your doctor if your blood sugar is above your goal numbers for 2 tests in a row.  Have  your A1c (hemoglobin A1c) level checked at least two times a year. Have it checked more often if your doctor tells you to. Your doctor will set treatment goals for you. Generally, you should have these blood sugar levels:  Before meals (preprandial): 80-130 mg/dL (4.4-7.2 mmol/L).  After meals (postprandial): lower than 180 mg/dL (10 mmol/L).  A1c level: less than 7%.  What do I need to know about high blood sugar? High blood sugar is called hyperglycemia. Know the signs of high blood sugar. Signs may include:  Feeling: ? Thirsty. ? Hungry. ? Very tired.  Needing to pee (urinate) more than usual.  Blurry vision.  What do I need to know about low blood sugar? Low blood sugar is called hypoglycemia. This is when blood sugar is at or below 70 mg/dL (3.9 mmol/L). Symptoms may include:  Feeling: ? Hungry. ? Worried or nervous (anxious). ? Sweaty and clammy. ? Confused. ? Dizzy. ? Sleepy. ? Sick to your stomach (nauseous).  Having: ? A fast heartbeat (palpitations). ? A headache. ? A change in your vision. ? Jerky movements that you cannot control (seizure). ? Nightmares. ? Tingling or no feeling (numbness) around the mouth, lips, or tongue.  Having trouble  with: ? Talking. ? Paying attention (concentrating). ? Moving (coordination). ? Sleeping.  Shaking.  Passing out (fainting).  Getting upset easily (irritability).  Treating low blood sugar  To treat low blood sugar, eat or drink something sugary right away. If you can think clearly and swallow safely, follow the 15:15 rule:  Take 15 grams of a fast-acting carb (carbohydrate). Some fast-acting carbs are: ? 1 tube of glucose gel. ? 3 sugar tablets (glucose pills). ? 6-8 pieces of hard candy. ? 4 oz (120 mL) of fruit juice. ? 4 oz (120 mL) regular (not diet) soda.  Check your blood sugar 15 minutes after you take the carb.  If your blood sugar is still at or below 70 mg/dL (3.9 mmol/L), take 15 grams of a carb again.  If your blood sugar does not go above 70 mg/dL (3.9 mmol/L) after 3 tries, get help right away.  After your blood sugar goes back to normal, eat a meal or a snack within 1 hour.  Treating very low blood sugar If your blood sugar is at or below 54 mg/dL (3 mmol/L), you have very low blood sugar (severe hypoglycemia). This is an emergency. Do not wait to see if the symptoms will go away. Get medical help right away. Call your local emergency services (911 in the [place] Do not drive yourself to the hospital. If you have very low blood sugar and you cannot eat or drink, you may need a glucagon shot (injection). A family member or friend should learn how to check your blood sugar and how to give you a glucagon shot. Ask your doctor if you need to have a glucagon shot kit at home. What else is important to manage my diabetes? Medicine Follow these instructions about insulin and diabetes medicines:  Take them as told by your doctor.  Adjust them as told by your doctor.  Do not run out of them.  Having diabetes can raise your risk for other long-term conditions. These include heart or kidney disease. Your doctor may prescribe medicines to help prevent problems from  diabetes. Food   Make healthy food choices. These include: ? Chicken, fish, egg whites, and beans. ? Oats, whole wheat, bulgur, brown rice, quinoa, and millet. ? Fresh fruits and vegetables. ?  Low-fat dairy products. ? Nuts, avocado, olive oil, and canola oil.  Make a food plan with a specialist (dietitian).  Follow instructions from your doctor about what you cannot eat or drink.  Drink enough fluid to keep your pee (urine) clear or pale yellow.  Eat healthy snacks between healthy meals.  Keep track of carbs that you eat. Read food labels. Learn food serving sizes.  Follow your sick day plan when you cannot eat or drink normally. Make this plan with your doctor so it is ready to use. Activity  Exercise at least 3 times a week.  Do not go more than 2 days without exercising.  Talk with your doctor before you start a new exercise. Your doctor may need to adjust your insulin, medicines, or food. Lifestyle   Do not use any tobacco products. These include cigarettes, chewing tobacco, and e-cigarettes.If you need help quitting, ask your doctor.  Ask your doctor how much alcohol is safe for you.  Learn to deal with stress. If you need help with this, ask your doctor. Body care  Stay up to date with your shots (immunizations).  Have your eyes and feet checked by a doctor as often as told.  Check your skin and feet every day. Check for cuts, bruises, redness, blisters, or sores.  Brush your teeth and gums two times a day.  Floss at least one time a day.  Go to the dentist least one time every 6 months.  Stay at a healthy weight. General instructions   Take over-the-counter and prescription medicines only as told by your doctor.  Share your diabetes care plan with: ? Your work or school. ? People you live with.  Check your pee (urine) for ketones: ? When you are sick. ? As told by your doctor.  Carry a card or wear jewelry that says that you have  diabetes.  Ask your doctor: ? Do I need to meet with a diabetes educator? ? Where can I find a support group for people with diabetes?  Keep all follow-up visits as told by your doctor. This is important. Where to find more information: To learn more about diabetes, visit:  American Diabetes Association: www.diabetes.org  American Association of Diabetes Educators: www.diabeteseducator.org/patient-resources  This information is not intended to replace advice given to you by your health care provider. Make sure you discuss any questions you have with your health care provider. Document Released: 10/12/2015 Document Revised: 11/26/2015 Document Reviewed: 07/24/2015 Elsevier Interactive Patient Education  2018 [employer].    IF you received an x-ray today, you will receive an invoice from [facility]. Please contact [facility] at [phone] with questions or concerns regarding your invoice.   IF you received labwork today, you will receive an invoice from [facility]. Please contact [facility] at [phone] with questions or concerns regarding your invoice.   Our billing staff will not be able to assist you with questions regarding bills from these companies.  You will be contacted with the lab results as soon as they are available. The fastest way to get your results is to activate your My Chart account. Instructions are located on the last page of this paperwork. If you have not heard from [place] regarding the results in 2 weeks, please contact this office.

## 2018-04-03 NOTE — Progress Notes (Addendum)
Subjective:  By signing my name below, I, [name], attest that this documentation has been prepared under the direction and in the presence of [name], MD Electronically Signed: Ladene Artist, ED Scribe 04/03/2018 at 9:21 AM.   Patient ID: Steven Benjamin, male    DOB: 1951/02/20, 67 y.o.   MRN: [id number]  Chief Complaint  Patient presents with  . Chronic Conditions    3 m f/u   . Diabetes   HPI Steven Benjamin is a 67 y.o. male who presents to Primary Care at [facility] for f/u.  DM Lab Results  Component Value Date   HGBA1C 7.4 (H) 01/01/2018  New diagnosis 05/2017. When discussed in July, he was walking for exercise a few days/wk. Recommended starting Metformin once/day but did not hear back from him. On ACE and statin. Pneumovax and foot exam on 7/1. Recommended optho exam. - Pt states that he has not checked his e-mail so he has not started Metformin. He is not checking his blood glucose outside of the office. Exercising a few days/wk, plans to start exercising more when the weather cools.  HTN with Chronic Diastolic Heart Failure Lab Results  Component Value Date   CREATININE 1.35 (H) 01/01/2018   BP Readings from Last 3 Encounters:  04/03/18 (!) 150/108  01/01/18 138/80  09/25/17 (!) 150/82  Seen by Dr. Gwenlyn Found in 06/2017. Maxzide and lasix helped peripheral edema. Lasix 20 mg prn, Lisinopril 40 mg for BP, Maxzide 25 mg, Metoprolol 50 and 25 mg. - Pt plans to call Dr. Gwenlyn Found sometime within the next 2 wks. States leg swelling is better with taking Lasix every other day; last dose was 3 days ago. Denies lightheadedness, dizziness, any side-effects.  Urinary Frequency Pt has been waking up to urinate 2-3 times/night over the past 2 wks. Also reports some increased urinary freq during the day. He is taking Lasix in the morning. Denies fever, dysuria, difficulty urinating, abdominal pain, n/v, back pain.  Depression with Anxiety Symptoms Primarily Anxiety. Doing well on  Zoloft 25 mg qd and meeting with his pastor. - Reports his depression and anxiety symptoms have been doing well.  Hyperlipidemia Lab Results  Component Value Date   CHOL 194 01/01/2018   HDL 60 01/01/2018   LDLCALC 89 01/01/2018   LDLDIRECT 140.5 06/19/2006   TRIG 223 (H) 01/01/2018   CHOLHDL 3.2 01/01/2018   Lab Results  Component Value Date   ALT 26 01/01/2018   AST 26 01/01/2018   ALKPHOS 64 01/01/2018   BILITOT 0.6 01/01/2018  Zocor 20 mg qd.   Patient Active Problem List   Diagnosis Date Noted  . Chronic diastolic heart failure ([place]) 06/07/2017  . ACUTE BRONCHITIS 08/11/2007  . HYPERLIPIDEMIA 09/27/2006  . ANEMIA-IRON DEFICIENCY 09/27/2006  . Essential hypertension 09/27/2006  . ALLERGIC RHINITIS 09/27/2006  . DIVERTICULOSIS, COLON 09/27/2006  . COLONIC POLYPS, HX OF 09/27/2006   Past Medical History:  Diagnosis Date  . Hypertension   . Kidney stone    No past surgical history on file. Allergies  Allergen Reactions  . Lipitor [Atorvastatin Calcium] Other (See Comments)    Muscle pains  . Vibra-Tab [Doxycycline] Other (See Comments)    Muscle pain   . Z-Pak [Azithromycin]   . Erythromycin Rash   Prior to Admission medications   Medication Sig Start Date End Date Taking? Authorizing Provider  cetirizine (ZYRTEC) 10 MG tablet Take 10 mg by mouth daily. Reported on 07/07/2015   Yes [provider]  furosemide (LASIX) 20 MG tablet Take 1 tablet (20 mg total) by mouth daily. Patient taking differently: Take 20 mg by mouth as needed.  01/01/18  Yes [name], MD  lisinopril (PRINIVIL,ZESTRIL) 40 MG tablet TAKE 1 TABLET(40 MG) BY MOUTH DAILY 01/01/18  Yes [name], MD  metoprolol succinate (TOPROL-XL) 25 MG 24 hr tablet Take 1 tablet (25 mg total) by mouth daily. 01/01/18  Yes [name], MD  metoprolol succinate (TOPROL-XL) 50 MG 24 hr tablet Take 1 tablet (50 mg total) by mouth daily. Take with or immediately following a meal. 09/25/17  Yes  [name], MD  sertraline (ZOLOFT) 25 MG tablet Take 1 tablet (25 mg total) by mouth daily. 01/01/18  Yes [name], MD  simvastatin (ZOCOR) 20 MG tablet Take 1 tablet (20 mg total) by mouth at bedtime. 01/01/18  Yes [name], MD  triamterene-hydrochlorothiazide (MAXZIDE-25) 37.5-25 MG tablet Take 1 tablet by mouth daily. 01/01/18  Yes [name], MD   Social History   Socioeconomic History  . Marital status: Married    Spouse name: Not on file  . Number of children: Not on file  . Years of education: Not on file  . Highest education level: Not on file  Occupational History  . Not on file  Social Needs  . Financial resource strain: Not on file  . Food insecurity:    Worry: Not on file    Inability: Not on file  . Transportation needs:    Medical: Not on file    Non-medical: Not on file  Tobacco Use  . Smoking status: Former Research scientist (life sciences)  . Smokeless tobacco: Never Used  Substance and Sexual Activity  . Alcohol use: No  . Drug use: No  . Sexual activity: Not on file  Lifestyle  . Physical activity:    Days per week: Not on file    Minutes per session: Not on file  . Stress: Not on file  Relationships  . Social connections:    Talks on phone: Not on file    Gets together: Not on file    Attends religious service: Not on file    Active member of club or organization: Not on file    Attends meetings of clubs or organizations: Not on file    Relationship status: Not on file  . Intimate partner violence:    Fear of current or ex partner: Not on file    Emotionally abused: Not on file    Physically abused: Not on file    Forced sexual activity: Not on file  Other Topics Concern  . Not on file  Social History Narrative  . Not on file   Review of Systems  Constitutional: Negative for fatigue, fever and unexpected weight change.  Eyes: Negative for visual disturbance.  Respiratory: Negative for cough, chest tightness and shortness of breath.     Cardiovascular: Negative for chest pain, palpitations and leg swelling.  Gastrointestinal: Negative for abdominal pain, blood in stool, nausea and vomiting.  Genitourinary: Positive for frequency. Negative for difficulty urinating and dysuria.  Musculoskeletal: Negative for back pain.  Neurological: Negative for dizziness, light-headedness and headaches.  Psychiatric/Behavioral: Negative for dysphoric mood. The patient is not nervous/anxious.       Objective:   Physical Exam  Constitutional: He is oriented to person, place, and time. He appears well-developed and well-nourished.  HENT:  Head: Normocephalic and atraumatic.  Eyes: Pupils are equal, round, and reactive to  light. EOM are normal.  Neck: No JVD present. Carotid bruit is not present.  Cardiovascular: Normal rate, regular rhythm and normal heart sounds.  No murmur heard. Pulmonary/Chest: Effort normal and breath sounds normal. He has no rales.  Musculoskeletal: He exhibits edema.  1+ pitting edema to lower mid tibia.  Neurological: He is alert and oriented to person, place, and time.  Skin: Skin is warm and dry.  Psychiatric: He has a normal mood and affect.  Vitals reviewed.  Vitals:   04/03/18 0857 04/03/18 0900  BP: (!) 160/100 (!) 150/108  Pulse: 87   Temp: 98.3 F (36.8 C)   TempSrc: Oral   SpO2: 94%   Weight: 206 lb 12.8 oz (93.8 kg)   Height: '5\' 7"'  (1.702 m)    Results for orders placed or performed in visit on 04/03/18  POCT urinalysis dipstick  Result Value Ref Range   Color, UA yellow yellow   Clarity, UA clear clear   Glucose, UA =500 (A) negative mg/dL   Bilirubin, UA negative negative   Ketones, POC UA trace (5) (A) negative mg/dL   Spec Grav, UA 1.015 1.010 - 1.025   Blood, UA trace-intact (A) negative   pH, UA 5.5 5.0 - 8.0   Protein Ur, POC =30 (A) negative mg/dL   Urobilinogen, UA 0.2 0.2 or 1.0 E.U./dL   Nitrite, UA Negative Negative   Leukocytes, UA Negative Negative  POCT Microscopic  Urinalysis ([facility])  Result Value Ref Range   WBC,UR,HPF,POC None None WBC/hpf   RBC,UR,HPF,POC Few (A) None RBC/hpf   Bacteria None None, Too numerous to count   Mucus Absent Absent   Epithelial Cells, UR Per Microscopy None None, Too numerous to count cells/hpf  POCT glucose (manual entry)  Result Value Ref Range   POC Glucose >444 70 - 99 mg/dl  POCT glycosylated hemoglobin (Hb A1C)  Result Value Ref Range   Hemoglobin A1C 11.9 (A) 4.0 - 5.6 %   HbA1c POC (<> result, manual entry)     HbA1c, POC (prediabetic range)     HbA1c, POC (controlled diabetic range)     10:18 AM-Denies n/v, blurry vision, HA, dizziness. Feels well currently with above reading.    Assessment & Plan:    Steven Benjamin is a 67 y.o. male Type 2 diabetes mellitus with hyperglycemia, without long-term current use of insulin ([place]) - Plan: POCT glucose (manual entry), POCT glycosylated hemoglobin (Hb A1C), metFORMIN (GLUCOPHAGE) 500 MG tablet, Insulin Glargine (LANTUS SOLOSTAR) 100 UNIT/ML Solostar Pen, Basic metabolic panel, CANCELED: Basic metabolic panel Nocturia - Plan: PSA, POCT urinalysis dipstick, POCT Microscopic Urinalysis ([facility]) Urinary frequency - Plan: PSA, POCT urinalysis dipstick, POCT Microscopic Urinalysis ([facility]), POCT glucose (manual entry) Need for influenza vaccination - Plan: Flu vaccine HIGH DOSE PF (Fluzone High dose)  -Uncontrolled.  Has not started medication from last visit, but A1c significantly higher.  Glucose too high to read in office, but asymptomatic.  -Check BMP to evaluate bicarb, lytes, and actual glucose reading, but will initially start with metformin 500 mg twice daily, start Lantus 10 units tonight, continue same dose nightly until follow-up in the next few days.  ER precautions given if any new symptoms.  Understanding expressed.   Chronic diastolic heart failure (H[facility]) - Plan: furosemide (LASIX) 20 MG tablet  -Stable, continue same dose Lasix as needed with cardiology follow-up as  planned  Essential hypertension - Plan: metoprolol succinate (TOPROL-XL) 100 MG 24 hr tablet, lisinopril (PRINIVIL,ZESTRIL) 40 MG tablet, triamterene-hydrochlorothiazide (MAXZIDE-25) 37.5-25  MG tablet  -Elevated readings, will increase Toprol to 100 mg daily, continue lisinopril and triamterene HCT at same doses.  Orthostatic/bradycardia potential side effects discussed with higher dose  Anxiety state - Plan: sertraline (ZOLOFT) 25 MG tablet  -Stable, continue same dose of Zoloft  Hyperlipidemia, unspecified hyperlipidemia type - Plan: simvastatin (ZOCOR) 20 MG tablet  -Tolerating Zocor, no changes for now.    Meds ordered this encounter  Medications  . metoprolol succinate (TOPROL-XL) 100 MG 24 hr tablet    Sig: Take 1 tablet (100 mg total) by mouth daily. Take with or immediately following a meal.    Dispense:  90 tablet    Refill:  3  . furosemide (LASIX) 20 MG tablet    Sig: Take 1 tablet (20 mg total) by mouth daily as needed.    Dispense:  90 tablet    Refill:  0  . lisinopril (PRINIVIL,ZESTRIL) 40 MG tablet    Sig: TAKE 1 TABLET(40 MG) BY MOUTH DAILY    Dispense:  90 tablet    Refill:  1  . sertraline (ZOLOFT) 25 MG tablet    Sig: Take 1 tablet (25 mg total) by mouth daily.    Dispense:  90 tablet    Refill:  1  . simvastatin (ZOCOR) 20 MG tablet    Sig: Take 1 tablet (20 mg total) by mouth at bedtime.    Dispense:  90 tablet    Refill:  1  . triamterene-hydrochlorothiazide (MAXZIDE-25) 37.5-25 MG tablet    Sig: Take 1 tablet by mouth daily.    Dispense:  90 tablet    Refill:  1  . metFORMIN (GLUCOPHAGE) 500 MG tablet    Sig: Take 1 tablet (500 mg total) by mouth 2 (two) times daily with a meal.    Dispense:  180 tablet    Refill:  1  . Insulin Glargine (LANTUS SOLOSTAR) 100 UNIT/ML Solostar Pen    Sig: Inject 10 Units into the skin daily.    Dispense:  5 pen    Refill:  1   Patient Instructions   If you have lab work done today you will be contacted with your  lab results within the next 2 weeks.  If you have not heard from [place] then please contact us. The fastest way to get your results is to register for My Chart.  Call cardiologist to schedule follow up appointment. Blood pressure too high - increased toprol to 159m per day. Watch for lightheadedness or dizziness at that dose.   Unfortunately the blood sugar is very high today.  That is likely the reason for the frequent urination.  Start metformin twice per day, but we will also need to start insulin to get that to a safer level for now.  Please bring the insulin back to the office today and we will show you how to use that.  There is more information below.  Recheck with me in 3 days, but depending on numbers tonight I may have you come back sooner.  If you have any nausea, vomiting, abdominal pain, dizziness, headache, or any new symptoms proceed to the emergency room   Insulin Injection Instructions, Using Insulin Pens, Adult A subcutaneous injection is a shot of medicine that is injected into the layer of fat between skin and muscle. People with type 1 diabetes must take insulin because their bodies do not make it. People with type 2 diabetes may need to take insulin. There are many different types of insulin.  The type of insulin that you take may determine how many injections you give yourself and when you need to take the injections. Choosing a site for injection Insulin absorption varies from site to site. It is best to inject insulin within the same body area, using a different spot in that area for each injection. Do not inject the insulin in the same spot for each injection. There are five main areas that can be used for injecting. These areas include:  Abdomen. This is the preferred area.  Front of thigh.  Upper, outer side of thigh.  Back of upper arm.  Buttocks.  Using an insulin pen First, follow the steps for Getting Ready, then continue with the steps for Injecting the  Insulin. Getting Ready 1. Wash your hands with soap and water. If soap and water are not available, use hand sanitizer. 2. Check the expiration date and type of insulin in the pen. 3. If you are using CLEAR insulin, check to see that it is clear and free of clumps. 4. If you are using CLOUDY insulin, gently roll the pen between your palms several times, or tip the pen up and down several times to mix up the medicine. Do not shake the pen. 5. Remove the cap from the insulin pen. 6. Use an alcohol wipe to clean the rubber stopper of the pen cartridge. 7. Remove the protective paper tab from the disposable needle. Do not let the needle touch anything. 8. Screw the needle onto the pen. 9. Remove the outer and inner plastic covers from the needle. Do not throw away the outer plastic cover yet. 10. Prime the insulin pen by turning the button (dial) to 2 units. Hold the pen with the needle pointing up, and push the button on the opposite end of the pen until a drop of insulin appears at the needle tip. If no insulin appears, repeat this step. 11. Dial the number of units of insulin that you will be injecting. Injecting the Insulin  1. Use an alcohol wipe to clean the site where you will be injecting the needle. Let the site air-dry. 2. Hold the pen in the palm of your writing hand with your thumb on the top. 3. If directed by your health care provider, use your other hand to pinch and hold about an inch of skin at the injection site. Do not directly touch the cleaned part of the skin. 4. Gently but quickly, put the needle straight into the skin. The needle should be at a 90-degree angle (perpendicular) to the skin, as if to form the letter "L." ? For example, if you are giving an injection in the abdomen, the abdomen forms one "leg" of the "L" and the needle forms the other "leg" of the "L." 5. For adults who have a small amount of body fat, the needle may need to be injected at a 45-degree angle  instead. Your health care provider will tell you if this is necessary. ? A 45-degree angle looks like the letter "V." 6. When the needle is completely inserted into the skin, use the thumb of your writing hand to push the top button of the pen down all the way to inject the insulin. 7. Let go of the skin that you are pinching. Continue to hold the pen in place with your writing hand. 8. Wait five seconds, then pull the needle straight out of the skin. 9. Carefully put the larger (outer) plastic cover of the needle back  over the needle, then unscrew the capped needle and discard it in a sharps container, such as an empty plastic bottle with a cover. 10. Put the plastic cap back on the insulin pen. Throwing away supplies  Discard all used needles in a puncture-proof sharps disposal container. You can ask your local pharmacy about where you can get this kind of disposal container, or you can use an empty liquid laundry detergent bottle that has a cover.  Follow the disposal regulations for the area where you live. Do not use any needle more than one time.  Throw away empty disposable pens in the regular trash. What questions should I ask my health care provider?  How often should I be taking insulin?  How often should I check my blood glucose?  What amount of insulin should I be taking at each time?  What are the side effects?  What should I do if my blood glucose is too high?  What should I do if my blood glucose is too low?  What should I do if I forget to take my insulin?  What number should I call if I have questions? Where can I get more information?  American Diabetes Association (ADA): www.diabetes.org  American Association of Diabetes Educators (AADE) Patient Resources: https://www.diabeteseducator.org/patient-resources This information is not intended to replace advice given to you by your health care provider. Make sure you discuss any questions you have with your health  care provider. Document Released: 07/24/2015 Document Revised: 11/26/2015 Document Reviewed: 07/24/2015 Elsevier Interactive Patient Education  2018 [employer].  Type 2 Diabetes Mellitus, Self Care, Adult When you have type 2 diabetes (type 2 diabetes mellitus), you must keep your blood sugar (glucose) under control. You can do this with:  Nutrition.  Exercise.  Lifestyle changes.  Medicines or insulin, if needed.  Support from your doctors and others.  How do I manage my blood sugar?  Check your blood sugar level every day, as often as told.  Call your doctor if your blood sugar is above your goal numbers for 2 tests in a row.  Have your A1c (hemoglobin A1c) level checked at least two times a year. Have it checked more often if your doctor tells you to. Your doctor will set treatment goals for you. Generally, you should have these blood sugar levels:  Before meals (preprandial): 80-130 mg/dL (4.4-7.2 mmol/L).  After meals (postprandial): lower than 180 mg/dL (10 mmol/L).  A1c level: less than 7%.  What do I need to know about high blood sugar? High blood sugar is called hyperglycemia. Know the signs of high blood sugar. Signs may include:  Feeling: ? Thirsty. ? Hungry. ? Very tired.  Needing to pee (urinate) more than usual.  Blurry vision.  What do I need to know about low blood sugar? Low blood sugar is called hypoglycemia. This is when blood sugar is at or below 70 mg/dL (3.9 mmol/L). Symptoms may include:  Feeling: ? Hungry. ? Worried or nervous (anxious). ? Sweaty and clammy. ? Confused. ? Dizzy. ? Sleepy. ? Sick to your stomach (nauseous).  Having: ? A fast heartbeat (palpitations). ? A headache. ? A change in your vision. ? Jerky movements that you cannot control (seizure). ? Nightmares. ? Tingling or no feeling (numbness) around the mouth, lips, or tongue.  Having trouble with: ? Talking. ? Paying attention (concentrating). ? Moving  (coordination). ? Sleeping.  Shaking.  Passing out (fainting).  Getting upset easily (irritability).  Treating low blood sugar  To  treat low blood sugar, eat or drink something sugary right away. If you can think clearly and swallow safely, follow the 15:15 rule:  Take 15 grams of a fast-acting carb (carbohydrate). Some fast-acting carbs are: ? 1 tube of glucose gel. ? 3 sugar tablets (glucose pills). ? 6-8 pieces of hard candy. ? 4 oz (120 mL) of fruit juice. ? 4 oz (120 mL) regular (not diet) soda.  Check your blood sugar 15 minutes after you take the carb.  If your blood sugar is still at or below 70 mg/dL (3.9 mmol/L), take 15 grams of a carb again.  If your blood sugar does not go above 70 mg/dL (3.9 mmol/L) after 3 tries, get help right away.  After your blood sugar goes back to normal, eat a meal or a snack within 1 hour.  Treating very low blood sugar If your blood sugar is at or below 54 mg/dL (3 mmol/L), you have very low blood sugar (severe hypoglycemia). This is an emergency. Do not wait to see if the symptoms will go away. Get medical help right away. Call your local emergency services (911 in the [place] Do not drive yourself to the hospital. If you have very low blood sugar and you cannot eat or drink, you may need a glucagon shot (injection). A family member or friend should learn how to check your blood sugar and how to give you a glucagon shot. Ask your doctor if you need to have a glucagon shot kit at home. What else is important to manage my diabetes? Medicine Follow these instructions about insulin and diabetes medicines:  Take them as told by your doctor.  Adjust them as told by your doctor.  Do not run out of them.  Having diabetes can raise your risk for other long-term conditions. These include heart or kidney disease. Your doctor may prescribe medicines to help prevent problems from diabetes. Food   Make healthy food choices. These  include: ? Chicken, fish, egg whites, and beans. ? Oats, whole wheat, bulgur, brown rice, quinoa, and millet. ? Fresh fruits and vegetables. ? Low-fat dairy products. ? Nuts, avocado, olive oil, and canola oil.  Make a food plan with a specialist (dietitian).  Follow instructions from your doctor about what you cannot eat or drink.  Drink enough fluid to keep your pee (urine) clear or pale yellow.  Eat healthy snacks between healthy meals.  Keep track of carbs that you eat. Read food labels. Learn food serving sizes.  Follow your sick day plan when you cannot eat or drink normally. Make this plan with your doctor so it is ready to use. Activity  Exercise at least 3 times a week.  Do not go more than 2 days without exercising.  Talk with your doctor before you start a new exercise. Your doctor may need to adjust your insulin, medicines, or food. Lifestyle   Do not use any tobacco products. These include cigarettes, chewing tobacco, and e-cigarettes.If you need help quitting, ask your doctor.  Ask your doctor how much alcohol is safe for you.  Learn to deal with stress. If you need help with this, ask your doctor. Body care  Stay up to date with your shots (immunizations).  Have your eyes and feet checked by a doctor as often as told.  Check your skin and feet every day. Check for cuts, bruises, redness, blisters, or sores.  Brush your teeth and gums two times a day.  Floss at least one time  a day.  Go to the dentist least one time every 6 months.  Stay at a healthy weight. General instructions   Take over-the-counter and prescription medicines only as told by your doctor.  Share your diabetes care plan with: ? Your work or school. ? People you live with.  Check your pee (urine) for ketones: ? When you are sick. ? As told by your doctor.  Carry a card or wear jewelry that says that you have diabetes.  Ask your doctor: ? Do I need to meet with a diabetes  educator? ? Where can I find a support group for people with diabetes?  Keep all follow-up visits as told by your doctor. This is important. Where to find more information: To learn more about diabetes, visit:  American Diabetes Association: www.diabetes.org  American Association of Diabetes Educators: www.diabeteseducator.org/patient-resources  This information is not intended to replace advice given to you by your health care provider. Make sure you discuss any questions you have with your health care provider. Document Released: 10/12/2015 Document Revised: 11/26/2015 Document Reviewed: 07/24/2015 Elsevier Interactive Patient Education  2018 [employer].    IF you received an x-ray today, you will receive an invoice from [facility]. Please contact [facility] at [phone] with questions or concerns regarding your invoice.   IF you received labwork today, you will receive an invoice from [place]. Please contact [facility] at [phone] with questions or concerns regarding your invoice.   Our billing staff will not be able to assist you with questions regarding bills from these companies.  You will be contacted with the lab results as soon as they are available. The fastest way to get your results is to activate your My Chart account. Instructions are located on the last page of this paperwork. If you have not heard from [place] regarding the results in 2 weeks, please contact this office.       I personally performed the services described in this documentation, which was scribed in my presence. The recorded information has been reviewed and considered for accuracy and completeness, addended by me as needed, and agree with information above.  Signed,   [name], MD [facility].  04/03/18 9:34 PM

## 2018-04-04 LAB — PSA: PROSTATE SPECIFIC AG, SERUM: 3.6 ng/mL (ref 0.0–4.0)

## 2018-04-06 ENCOUNTER — Encounter: Payer: Self-pay | Admitting: Family Medicine

## 2018-04-06 ENCOUNTER — Ambulatory Visit (INDEPENDENT_AMBULATORY_CARE_PROVIDER_SITE_OTHER): Payer: Medicare Other | Admitting: Family Medicine

## 2018-04-06 ENCOUNTER — Telehealth: Payer: Self-pay | Admitting: Family Medicine

## 2018-04-06 VITALS — BP 144/75 | HR 82 | Temp 97.5°F | Resp 17 | Ht 67.0 in | Wt 205.0 lb

## 2018-04-06 DIAGNOSIS — E1165 Type 2 diabetes mellitus with hyperglycemia: Secondary | ICD-10-CM

## 2018-04-06 DIAGNOSIS — Z794 Long term (current) use of insulin: Secondary | ICD-10-CM

## 2018-04-06 DIAGNOSIS — I10 Essential (primary) hypertension: Secondary | ICD-10-CM

## 2018-04-06 LAB — GLUCOSE, POCT (MANUAL RESULT ENTRY): POC Glucose: 261 mg/dl — AB (ref 70–99)

## 2018-04-06 MED ORDER — GLUCOSE BLOOD VI STRP: ORAL_STRIP | 4 refills | Status: DC

## 2018-04-06 NOTE — Progress Notes (Signed)
Subjective:  By signing my name below, I, [name], attest that this documentation has been prepared under the direction and in the presence of [name], MD Electronically Signed: Ladene Benjamin, ED Scribe 04/06/2018 at 12:20 PM.   Patient ID: Steven Benjamin, male    DOB: Feb 07, 1951, 67 y.o.   MRN: [id number]  Chief Complaint  Patient presents with  . Diabetes   HPI Steven Benjamin is a 67 y.o. male who presents to Primary Care at [facility] for f/u. Last seen 10/1, new diagnosis 05/2017. recommended starting metformin in July as A1C remained elevated at 7.4, had not been started. At OV 3 days ago, he noted increased urinary freq x prior 2 wks. Denies abdominal pain, n/v, new symptoms. Glucose was too high to read. A1C elevated at 11.9. Started on metformin 500 mg bid and Lantus insulin 10 units qhs. BMP without acidosis. Glucose 449. Did check UA with few RBCs but no other signs of infection. PSA 3.6. - Pt has not been checking his blood glucose at home; he does not have a glucometer. He denies waking at night to urinate. Pt has been injecting 10 units insulin in the abdomen without any issues. Denies diarrhea or stomach upset with metformin, hypoglycemia lows, difficulty urinating, any other new side-effects.  HTN Lab Results  Component Value Date   CREATININE 1.19 04/03/2018   BP Readings from Last 3 Encounters:  04/06/18 (!) 144/75  04/03/18 (!) 150/108  01/01/18 138/80  Denies any side-effects.  Patient Active Problem List   Diagnosis Date Noted  . Chronic diastolic heart failure ([address]) 06/07/2017  . ACUTE BRONCHITIS 08/11/2007  . HYPERLIPIDEMIA 09/27/2006  . ANEMIA-IRON DEFICIENCY 09/27/2006  . Essential hypertension 09/27/2006  . ALLERGIC RHINITIS 09/27/2006  . DIVERTICULOSIS, COLON 09/27/2006  . COLONIC POLYPS, HX OF 09/27/2006   Past Medical History:  Diagnosis Date  . Hypertension   . Kidney stone    No past surgical history on file. Allergies  Allergen  Reactions  . Lipitor [Atorvastatin Calcium] Other (See Comments)    Muscle pains  . Vibra-Tab [Doxycycline] Other (See Comments)    Muscle pain   . Z-Pak [Azithromycin]   . Erythromycin Rash   Prior to Admission medications   Medication Sig Start Date End Date Taking? Authorizing Provider  cetirizine (ZYRTEC) 10 MG tablet Take 10 mg by mouth daily. Reported on 07/07/2015   Yes [provider]  furosemide (LASIX) 20 MG tablet Take 1 tablet (20 mg total) by mouth daily as needed. 04/03/18  Yes [name], MD  Insulin Glargine (LANTUS SOLOSTAR) 100 UNIT/ML Solostar Pen Inject 10 Units into the skin daily. 04/03/18  Yes [name], MD  lisinopril (PRINIVIL,ZESTRIL) 40 MG tablet TAKE 1 TABLET(40 MG) BY MOUTH DAILY 04/03/18  Yes [name], MD  metFORMIN (GLUCOPHAGE) 500 MG tablet Take 1 tablet (500 mg total) by mouth 2 (two) times daily with a meal. 04/03/18  Yes [name], MD  metoprolol succinate (TOPROL-XL) 100 MG 24 hr tablet Take 1 tablet (100 mg total) by mouth daily. Take with or immediately following a meal. 04/03/18  Yes [name], MD  sertraline (ZOLOFT) 25 MG tablet Take 1 tablet (25 mg total) by mouth daily. 04/03/18  Yes [name], MD  simvastatin (ZOCOR) 20 MG tablet Take 1 tablet (20 mg total) by mouth at bedtime. 04/03/18  Yes [name], MD  triamterene-hydrochlorothiazide (MAXZIDE-25) 37.5-25 MG tablet Take 1 tablet by mouth daily.  04/03/18  Yes [name], MD   Social History   Socioeconomic History  . Marital status: Married    Spouse name: Not on file  . Number of children: Not on file  . Years of education: Not on file  . Highest education level: Not on file  Occupational History  . Not on file  Social Needs  . Financial resource strain: Not on file  . Food insecurity:    Worry: Not on file    Inability: Not on file  . Transportation needs:    Medical: Not on file    Non-medical: Not on file  Tobacco Use   . Smoking status: Former Research scientist (life sciences)  . Smokeless tobacco: Never Used  Substance and Sexual Activity  . Alcohol use: No  . Drug use: No  . Sexual activity: Not on file  Lifestyle  . Physical activity:    Days per week: Not on file    Minutes per session: Not on file  . Stress: Not on file  Relationships  . Social connections:    Talks on phone: Not on file    Gets together: Not on file    Attends religious service: Not on file    Active member of club or organization: Not on file    Attends meetings of clubs or organizations: Not on file    Relationship status: Not on file  . Intimate partner violence:    Fear of current or ex partner: Not on file    Emotionally abused: Not on file    Physically abused: Not on file    Forced sexual activity: Not on file  Other Topics Concern  . Not on file  Social History Narrative  . Not on file   Review of Systems  Constitutional: Negative for fatigue and unexpected weight change.  Eyes: Negative for visual disturbance.  Respiratory: Negative for cough, chest tightness and shortness of breath.   Cardiovascular: Negative for chest pain, palpitations and leg swelling.  Gastrointestinal: Negative for abdominal pain, blood in stool and diarrhea.  Genitourinary: Negative for difficulty urinating and frequency.  Neurological: Negative for dizziness, light-headedness and headaches.      Objective:   Physical Exam  Constitutional: He is oriented to person, place, and time. He appears well-developed and well-nourished.  HENT:  Head: Normocephalic and atraumatic.  Eyes: Pupils are equal, round, and reactive to light. EOM are normal.  Neck: No JVD present. Carotid bruit is not present.  Cardiovascular: Normal rate, regular rhythm and normal heart sounds.  No murmur heard. Pulmonary/Chest: Effort normal and breath sounds normal. He has no rales.  Musculoskeletal: He exhibits no edema.  Neurological: He is alert and oriented to person, place, and  time.  Skin: Skin is warm and dry.  Psychiatric: He has a normal mood and affect.  Vitals reviewed.  Vitals:   04/06/18 1134  BP: (!) 144/75  Pulse: 82  Resp: 17  Temp: (!) 97.5 F (36.4 C)  TempSrc: Oral  SpO2: 98%  Weight: 205 lb (93 kg)  Height: '5\' 7"'  (1.702 m)   Results for orders placed or performed in visit on 04/06/18  POCT glucose (manual entry)  Result Value Ref Range   POC Glucose 261 (A) 70 - 99 mg/dl       Assessment & Plan:    RYATT CORSINO is a 67 y.o. male Type 2 diabetes mellitus with hyperglycemia, with long-term current use of insulin ([place]) - Plan: POCT glucose (manual entry), glucose blood  test strip  -Improving on Lantus and metformin, tolerating both without hypoglycemia.  Will slowly increase Lantus to 12 units for now and recheck in 1 week.  Hypoglycemic precautions discussed, continue metformin same dose.  Sample meter provided and discussed testing as well as handout on diabetes self-care.  Essential hypertension  -Improved, tolerating current regimen.  Will avoid further changes for now, recheck at next week's visit and decide if further adjustment needed  Discussed borderline PSA but normal, could recheck in 6 months, sooner if new urinary symptoms.  Meds ordered this encounter  Medications  . glucose blood test strip    Sig: Use as instructed, up to 3 times per day. DM2, uncontrolled with hyperglycemia, insulin dependent.    Dispense:  100 each    Refill:  4   Patient Instructions   For now, check blood sugar twice per day - either fasting or 2 hours after meal.  DO NOT skip meals and watch for low blood sugars as we increase insulin dose. recheck right away if that occurs.   Increase lantus to 12 units per night and recheck in next 1 week.   Prostate test in normal, range, but high normal. Recheck level in 6 months, sooner if new urinary symptoms.   Type 2 Diabetes Mellitus, Self Care, Adult When you have type 2 diabetes (type 2 diabetes  mellitus), you must keep your blood sugar (glucose) under control. You can do this with:  Nutrition.  Exercise.  Lifestyle changes.  Medicines or insulin, if needed.  Support from your doctors and others.  How do I manage my blood sugar?  Check your blood sugar level every day, as often as told.  Call your doctor if your blood sugar is above your goal numbers for 2 tests in a row.  Have your A1c (hemoglobin A1c) level checked at least two times a year. Have it checked more often if your doctor tells you to. Your doctor will set treatment goals for you. Generally, you should have these blood sugar levels:  Before meals (preprandial): 80-130 mg/dL (4.4-7.2 mmol/L).  After meals (postprandial): lower than 180 mg/dL (10 mmol/L).  A1c level: less than 7%.  What do I need to know about high blood sugar? High blood sugar is called hyperglycemia. Know the signs of high blood sugar. Signs may include:  Feeling: ? Thirsty. ? Hungry. ? Very tired.  Needing to pee (urinate) more than usual.  Blurry vision.  What do I need to know about low blood sugar? Low blood sugar is called hypoglycemia. This is when blood sugar is at or below 70 mg/dL (3.9 mmol/L). Symptoms may include:  Feeling: ? Hungry. ? Worried or nervous (anxious). ? Sweaty and clammy. ? Confused. ? Dizzy. ? Sleepy. ? Sick to your stomach (nauseous).  Having: ? A fast heartbeat (palpitations). ? A headache. ? A change in your vision. ? Jerky movements that you cannot control (seizure). ? Nightmares. ? Tingling or no feeling (numbness) around the mouth, lips, or tongue.  Having trouble with: ? Talking. ? Paying attention (concentrating). ? Moving (coordination). ? Sleeping.  Shaking.  Passing out (fainting).  Getting upset easily (irritability).  Treating low blood sugar  To treat low blood sugar, eat or drink something sugary right away. If you can think clearly and swallow safely, follow the  15:15 rule:  Take 15 grams of a fast-acting carb (carbohydrate). Some fast-acting carbs are: ? 1 tube of glucose gel. ? 3 sugar tablets (glucose pills). ? 6-8  pieces of hard candy. ? 4 oz (120 mL) of fruit juice. ? 4 oz (120 mL) regular (not diet) soda.  Check your blood sugar 15 minutes after you take the carb.  If your blood sugar is still at or below 70 mg/dL (3.9 mmol/L), take 15 grams of a carb again.  If your blood sugar does not go above 70 mg/dL (3.9 mmol/L) after 3 tries, get help right away.  After your blood sugar goes back to normal, eat a meal or a snack within 1 hour.  Treating very low blood sugar If your blood sugar is at or below 54 mg/dL (3 mmol/L), you have very low blood sugar (severe hypoglycemia). This is an emergency. Do not wait to see if the symptoms will go away. Get medical help right away. Call your local emergency services (911 in the [place] Do not drive yourself to the hospital. If you have very low blood sugar and you cannot eat or drink, you may need a glucagon shot (injection). A family member or friend should learn how to check your blood sugar and how to give you a glucagon shot. Ask your doctor if you need to have a glucagon shot kit at home. What else is important to manage my diabetes? Medicine Follow these instructions about insulin and diabetes medicines:  Take them as told by your doctor.  Adjust them as told by your doctor.  Do not run out of them.  Having diabetes can raise your risk for other long-term conditions. These include heart or kidney disease. Your doctor may prescribe medicines to help prevent problems from diabetes. Food   Make healthy food choices. These include: ? Chicken, fish, egg whites, and beans. ? Oats, whole wheat, bulgur, brown rice, quinoa, and millet. ? Fresh fruits and vegetables. ? Low-fat dairy products. ? Nuts, avocado, olive oil, and canola oil.  Make a food plan with a specialist (dietitian).  Follow  instructions from your doctor about what you cannot eat or drink.  Drink enough fluid to keep your pee (urine) clear or pale yellow.  Eat healthy snacks between healthy meals.  Keep track of carbs that you eat. Read food labels. Learn food serving sizes.  Follow your sick day plan when you cannot eat or drink normally. Make this plan with your doctor so it is ready to use. Activity  Exercise at least 3 times a week.  Do not go more than 2 days without exercising.  Talk with your doctor before you start a new exercise. Your doctor may need to adjust your insulin, medicines, or food. Lifestyle   Do not use any tobacco products. These include cigarettes, chewing tobacco, and e-cigarettes.If you need help quitting, ask your doctor.  Ask your doctor how much alcohol is safe for you.  Learn to deal with stress. If you need help with this, ask your doctor. Body care  Stay up to date with your shots (immunizations).  Have your eyes and feet checked by a doctor as often as told.  Check your skin and feet every day. Check for cuts, bruises, redness, blisters, or sores.  Brush your teeth and gums two times a day.  Floss at least one time a day.  Go to the dentist least one time every 6 months.  Stay at a healthy weight. General instructions   Take over-the-counter and prescription medicines only as told by your doctor.  Share your diabetes care plan with: ? Your work or school. ? People you live  with.  Check your pee (urine) for ketones: ? When you are sick. ? As told by your doctor.  Carry a card or wear jewelry that says that you have diabetes.  Ask your doctor: ? Do I need to meet with a diabetes educator? ? Where can I find a support group for people with diabetes?  Keep all follow-up visits as told by your doctor. This is important. Where to find more information: To learn more about diabetes, visit:  American Diabetes Association: www.diabetes.org  American  Association of Diabetes Educators: www.diabeteseducator.org/patient-resources  This information is not intended to replace advice given to you by your health care provider. Make sure you discuss any questions you have with your health care provider. Document Released: 10/12/2015 Document Revised: 11/26/2015 Document Reviewed: 07/24/2015 Elsevier Interactive Patient Education  [employer].    If you have lab work done today you will be contacted with your lab results within the next 2 weeks.  If you have not heard from [place] then please contact us. The fastest way to get your results is to register for My Chart.   IF you received an x-ray today, you will receive an invoice from [facility]. Please contact [facility] at [phone] with questions or concerns regarding your invoice.   IF you received labwork today, you will receive an invoice from [place]. Please contact [facility] at [phone] with questions or concerns regarding your invoice.   Our billing staff will not be able to assist you with questions regarding bills from these companies.  You will be contacted with the lab results as soon as they are available. The fastest way to get your results is to activate your My Chart account. Instructions are located on the last page of this paperwork. If you have not heard from [place] regarding the results in 2 weeks, please contact this office.       I personally performed the services described in this documentation, which was scribed in my presence. The recorded information has been reviewed and considered for accuracy and completeness, addended by me as needed, and agree with information above.  Signed,   [name], MD [facility] [place].  04/06/18 1:00 PM

## 2018-04-06 NOTE — Patient Instructions (Addendum)
For now, check blood sugar twice per day - either fasting or 2 hours after meal.  DO NOT skip meals and watch for low blood sugars as we increase insulin dose. recheck right away if that occurs.   Increase lantus to 12 units per night and recheck in next 1 week.   Prostate test in normal, range, but high normal. Recheck level in 6 months, sooner if new urinary symptoms.   Type 2 Diabetes Mellitus, Self Care, Adult When you have type 2 diabetes (type 2 diabetes mellitus), you must keep your blood sugar (glucose) under control. You can do this with:  Nutrition.  Exercise.  Lifestyle changes.  Medicines or insulin, if needed.  Support from your doctors and others.  How do I manage my blood sugar?  Check your blood sugar level every day, as often as told.  Call your doctor if your blood sugar is above your goal numbers for 2 tests in a row.  Have your A1c (hemoglobin A1c) level checked at least two times a year. Have it checked more often if your doctor tells you to. Your doctor will set treatment goals for you. Generally, you should have these blood sugar levels:  Before meals (preprandial): 80-130 mg/dL (4.4-7.2 mmol/L).  After meals (postprandial): lower than 180 mg/dL (10 mmol/L).  A1c level: less than 7%.  What do I need to know about high blood sugar? High blood sugar is called hyperglycemia. Know the signs of high blood sugar. Signs may include:  Feeling: ? Thirsty. ? Hungry. ? Very tired.  Needing to pee (urinate) more than usual.  Blurry vision.  What do I need to know about low blood sugar? Low blood sugar is called hypoglycemia. This is when blood sugar is at or below 70 mg/dL (3.9 mmol/L). Symptoms may include:  Feeling: ? Hungry. ? Worried or nervous (anxious). ? Sweaty and clammy. ? Confused. ? Dizzy. ? Sleepy. ? Sick to your stomach (nauseous).  Having: ? A fast heartbeat (palpitations). ? A headache. ? A change in your vision. ? Jerky  movements that you cannot control (seizure). ? Nightmares. ? Tingling or no feeling (numbness) around the mouth, lips, or tongue.  Having trouble with: ? Talking. ? Paying attention (concentrating). ? Moving (coordination). ? Sleeping.  Shaking.  Passing out (fainting).  Getting upset easily (irritability).  Treating low blood sugar  To treat low blood sugar, eat or drink something sugary right away. If you can think clearly and swallow safely, follow the 15:15 rule:  Take 15 grams of a fast-acting carb (carbohydrate). Some fast-acting carbs are: ? 1 tube of glucose gel. ? 3 sugar tablets (glucose pills). ? 6-8 pieces of hard candy. ? 4 oz (120 mL) of fruit juice. ? 4 oz (120 mL) regular (not diet) soda.  Check your blood sugar 15 minutes after you take the carb.  If your blood sugar is still at or below 70 mg/dL (3.9 mmol/L), take 15 grams of a carb again.  If your blood sugar does not go above 70 mg/dL (3.9 mmol/L) after 3 tries, get help right away.  After your blood sugar goes back to normal, eat a meal or a snack within 1 hour.  Treating very low blood sugar If your blood sugar is at or below 54 mg/dL (3 mmol/L), you have very low blood sugar (severe hypoglycemia). This is an emergency. Do not wait to see if the symptoms will go away. Get medical help right away. Call your local emergency services (  911 in the [place] Do not drive yourself to the hospital. If you have very low blood sugar and you cannot eat or drink, you may need a glucagon shot (injection). A family member or friend should learn how to check your blood sugar and how to give you a glucagon shot. Ask your doctor if you need to have a glucagon shot kit at home. What else is important to manage my diabetes? Medicine Follow these instructions about insulin and diabetes medicines:  Take them as told by your doctor.  Adjust them as told by your doctor.  Do not run out of them.  Having diabetes can raise  your risk for other long-term conditions. These include heart or kidney disease. Your doctor may prescribe medicines to help prevent problems from diabetes. Food   Make healthy food choices. These include: ? Chicken, fish, egg whites, and beans. ? Oats, whole wheat, bulgur, brown rice, quinoa, and millet. ? Fresh fruits and vegetables. ? Low-fat dairy products. ? Nuts, avocado, olive oil, and canola oil.  Make a food plan with a specialist (dietitian).  Follow instructions from your doctor about what you cannot eat or drink.  Drink enough fluid to keep your pee (urine) clear or pale yellow.  Eat healthy snacks between healthy meals.  Keep track of carbs that you eat. Read food labels. Learn food serving sizes.  Follow your sick day plan when you cannot eat or drink normally. Make this plan with your doctor so it is ready to use. Activity  Exercise at least 3 times a week.  Do not go more than 2 days without exercising.  Talk with your doctor before you start a new exercise. Your doctor may need to adjust your insulin, medicines, or food. Lifestyle   Do not use any tobacco products. These include cigarettes, chewing tobacco, and e-cigarettes.If you need help quitting, ask your doctor.  Ask your doctor how much alcohol is safe for you.  Learn to deal with stress. If you need help with this, ask your doctor. Body care  Stay up to date with your shots (immunizations).  Have your eyes and feet checked by a doctor as often as told.  Check your skin and feet every day. Check for cuts, bruises, redness, blisters, or sores.  Brush your teeth and gums two times a day.  Floss at least one time a day.  Go to the dentist least one time every 6 months.  Stay at a healthy weight. General instructions   Take over-the-counter and prescription medicines only as told by your doctor.  Share your diabetes care plan with: ? Your work or school. ? People you live with.  Check  your pee (urine) for ketones: ? When you are sick. ? As told by your doctor.  Carry a card or wear jewelry that says that you have diabetes.  Ask your doctor: ? Do I need to meet with a diabetes educator? ? Where can I find a support group for people with diabetes?  Keep all follow-up visits as told by your doctor. This is important. Where to find more information: To learn more about diabetes, visit:  American Diabetes Association: www.diabetes.org  American Association of Diabetes Educators: www.diabeteseducator.org/patient-resources  This information is not intended to replace advice given to you by your health care provider. Make sure you discuss any questions you have with your health care provider. Document Released: 10/12/2015 Document Revised: 11/26/2015 Document Reviewed: 07/24/2015 Elsevier Interactive Patient Education  [employer].  If you have lab work done today you will be contacted with your lab results within the next 2 weeks.  If you have not heard from us then please contact us. The fastest way to get your results is to register for My Chart. ° ° °IF you received an x-ray today, you will receive an invoice from [facility]. Please contact [facility] at [phone] with questions or concerns regarding your invoice.  ° °IF you received labwork today, you will receive an invoice from [facility]. Please contact [facility] at [phone] with questions or concerns regarding your invoice.  ° °Our billing staff will not be able to assist you with questions regarding bills from these companies. ° °You will be contacted with the lab results as soon as they are available. The fastest way to get your results is to activate your My Chart account. Instructions are located on the last page of this paperwork. If you have not heard from us regarding the results in 2 weeks, please contact this office. °  ° ° ° °

## 2018-04-06 NOTE — Telephone Encounter (Signed)
Patient was denied OneTouch Ultra Blue strips by his insurance company.

## 2018-04-07 MED ORDER — BLOOD GLUCOSE METER KIT: PACK | 0 refills | Status: DC

## 2018-04-07 NOTE — Telephone Encounter (Signed)
New meter rx and strips sent.

## 2018-04-12 ENCOUNTER — Other Ambulatory Visit: Payer: Self-pay

## 2018-04-12 ENCOUNTER — Ambulatory Visit (INDEPENDENT_AMBULATORY_CARE_PROVIDER_SITE_OTHER): Payer: Medicare Other | Admitting: Family Medicine

## 2018-04-12 ENCOUNTER — Encounter: Payer: Self-pay | Admitting: Family Medicine

## 2018-04-12 VITALS — BP 160/88 | HR 71 | Temp 98.1°F | Ht 67.0 in | Wt 208.0 lb

## 2018-04-12 DIAGNOSIS — I10 Essential (primary) hypertension: Secondary | ICD-10-CM | POA: Diagnosis not present

## 2018-04-12 DIAGNOSIS — R609 Edema, unspecified: Secondary | ICD-10-CM | POA: Diagnosis not present

## 2018-04-12 DIAGNOSIS — E1165 Type 2 diabetes mellitus with hyperglycemia: Secondary | ICD-10-CM

## 2018-04-12 DIAGNOSIS — Z794 Long term (current) use of insulin: Secondary | ICD-10-CM | POA: Diagnosis not present

## 2018-04-12 LAB — BASIC METABOLIC PANEL
BUN / CREAT RATIO: 19 (ref 10–24)
BUN: 23 mg/dL (ref 8–27)
CO2: 23 mmol/L (ref 20–29)
CREATININE: 1.18 mg/dL (ref 0.76–1.27)
Calcium: 9.1 mg/dL (ref 8.6–10.2)
Chloride: 95 mmol/L — ABNORMAL LOW (ref 96–106)
GFR, EST AFRICAN AMERICAN: 72 mL/min/{1.73_m2} (ref >59)
GFR, EST NON AFRICAN AMERICAN: 63 mL/min/{1.73_m2} (ref >59)
Glucose: 286 mg/dL — ABNORMAL HIGH (ref 65–99)
Potassium: 4.2 mmol/L (ref 3.5–5.2)
SODIUM: 136 mmol/L (ref 134–144)

## 2018-04-12 LAB — GLUCOSE, POCT (MANUAL RESULT ENTRY): POC Glucose: 304 mg/dl — AB (ref 70–99)

## 2018-04-12 MED ORDER — METFORMIN HCL ER 500 MG PO TB24: 500.0000 mg | ORAL_TABLET | Freq: Every day | ORAL | 1 refills | Status: DC

## 2018-04-12 NOTE — Progress Notes (Signed)
Subjective:  By signing my name below, I, Steven Benjamin, attest that this documentation has been prepared under the direction and in the presence of Steven Ray, MD. Electronically Signed: Moises Benjamin, [place]. 04/12/2018 , 10:25 AM .  Patient was seen in Room 9 .   Patient ID: Steven Benjamin, male    DOB: 1950/07/14, 67 y.o.   MRN: [id number] Chief Complaint  Patient presents with  . Diabetes    f/u   . metformin    has nausa and diarreah for med reaction    HPI Steven Benjamin is a 67 y.o. male  Here for follow up of diabetes. Patient was last seen on 04/06/18.   Diabetes His A1C was elevated to 11.9 on Oct 1st, and was started on Lantus 10 units qhs. His bicarb was normal; glucose was up to 449 at that time. He was also started on metformin 500 mg bid; did not have any diarrhea, nausea or vomiting at last visit. His Lantus was increased to 12 units, and continued metformin 500 mg bid.   Patient states he's been feeling nausea and having diarrhea, without vomiting. He's been taking metformin bid when he's not sick with diarrhea. In the past week, he takes metformin 2-3 days bid, and then qd for 1 day, then back to bid for 2-3 days. He denies diarrhea with metformin qd. He mentions his first diarrhea started last Friday (Oct 4th) evening. He denies chest pain, shortness of breath or blurry vision. He's still on Lantus 12 units.   He's been checking his Benjamin sugars at home, ranging from 200s-300s.   Lab Results  Component Value Date   HGBA1C 11.9 (A) 04/03/2018   Wt Readings from Last 3 Encounters:  04/12/18 208 lb (94.3 kg)  04/06/18 205 lb (93 kg)  04/03/18 206 lb 12.8 oz (93.8 kg)    HTN BP Readings from Last 3 Encounters:  04/12/18 (!) 160/88  04/06/18 (!) 144/75  04/03/18 (!) 150/108   He was doing well, stable BP at last visit 144/75. His Toprol was increased to 100 mg qd on Oct 1st due to elevated readings. He was continued on Maxzide and lisinopril same dose. His  next cardiologist visit is on Dec 10th.   Patient states he's been taking his Lasix every other day. He isn't able to take it on some days, like on church days, as he can't get to the bathroom in time. His last dose was on Tuesday. He denies checking his BP at home as he denies having a BP cuff at home. He does try to get out to walk. He denies chest pain, shortness of breath, lightheadedness or dizziness.   Patient Active Problem List   Diagnosis Date Noted  . Chronic diastolic heart failure ([place]) 06/07/2017  . ACUTE BRONCHITIS 08/11/2007  . HYPERLIPIDEMIA 09/27/2006  . ANEMIA-IRON DEFICIENCY 09/27/2006  . Essential hypertension 09/27/2006  . ALLERGIC RHINITIS 09/27/2006  . DIVERTICULOSIS, COLON 09/27/2006  . COLONIC POLYPS, HX OF 09/27/2006   Past Medical History:  Diagnosis Date  . Hypertension   . Kidney stone    No past surgical history on file. Allergies  Allergen Reactions  . Lipitor [Atorvastatin Calcium] Other (See Comments)    Muscle pains  . Vibra-Tab [Doxycycline] Other (See Comments)    Muscle pain   . Z-Pak [Azithromycin]   . Erythromycin Rash   Prior to Admission medications   Medication Sig Start Date End Date Taking? Authorizing Provider  Benjamin glucose meter kit  and supplies Dispense based on patient and insurance preference. Use up to four times daily as directed. (FOR ICD-10 E10.9, E11.9). 04/07/18   [name], MD  cetirizine (ZYRTEC) 10 MG tablet Take 10 mg by mouth daily. Reported on 07/07/2015    [provider]  furosemide (LASIX) 20 MG tablet Take 1 tablet (20 mg total) by mouth daily as needed. 04/03/18   [name], MD  glucose Benjamin test strip Use as instructed, up to 3 times per day. DM2, uncontrolled with hyperglycemia, insulin dependent. 04/06/18   [name], MD  Insulin Glargine (LANTUS SOLOSTAR) 100 UNIT/ML Solostar Pen Inject 10 Units into the skin daily. 04/03/18   [name], MD  lisinopril (PRINIVIL,ZESTRIL) 40  MG tablet TAKE 1 TABLET(40 MG) BY MOUTH DAILY 04/03/18   [name], MD  metFORMIN (GLUCOPHAGE) 500 MG tablet Take 1 tablet (500 mg total) by mouth 2 (two) times daily with a meal. 04/03/18   [name], MD  metoprolol succinate (TOPROL-XL) 100 MG 24 hr tablet Take 1 tablet (100 mg total) by mouth daily. Take with or immediately following a meal. 04/03/18   [name], MD  sertraline (ZOLOFT) 25 MG tablet Take 1 tablet (25 mg total) by mouth daily. 04/03/18   [name], MD  simvastatin (ZOCOR) 20 MG tablet Take 1 tablet (20 mg total) by mouth at bedtime. 04/03/18   [name], MD  triamterene-hydrochlorothiazide (MAXZIDE-25) 37.5-25 MG tablet Take 1 tablet by mouth daily. 04/03/18   [name], MD   Social History   Socioeconomic History  . Marital status: Married    Spouse name: Not on file  . Number of children: Not on file  . Years of education: Not on file  . Highest education level: Not on file  Occupational History  . Not on file  Social Needs  . Financial resource strain: Not on file  . Food insecurity:    Worry: Not on file    Inability: Not on file  . Transportation needs:    Medical: Not on file    Non-medical: Not on file  Tobacco Use  . Smoking status: Former Research scientist (life sciences)  . Smokeless tobacco: Never Used  Substance and Sexual Activity  . Alcohol use: No  . Drug use: No  . Sexual activity: Not on file  Lifestyle  . Physical activity:    Days per week: Not on file    Minutes per session: Not on file  . Stress: Not on file  Relationships  . Social connections:    Talks on phone: Not on file    Gets together: Not on file    Attends religious service: Not on file    Active member of club or organization: Not on file    Attends meetings of clubs or organizations: Not on file    Relationship status: Not on file  . Intimate partner violence:    Fear of current or ex partner: Not on file    Emotionally abused: Not on file    Physically  abused: Not on file    Forced sexual activity: Not on file  Other Topics Concern  . Not on file  Social History Narrative  . Not on file   Review of Systems  Constitutional: Negative for fatigue and unexpected weight change.  Eyes: Negative for visual disturbance.  Respiratory: Negative for cough, chest tightness and shortness of breath.   Cardiovascular: Negative for chest pain, palpitations and  leg swelling.  Gastrointestinal: Positive for diarrhea and nausea. Negative for abdominal pain, Benjamin in stool and vomiting.  Neurological: Negative for dizziness, light-headedness and headaches.       Objective:   Physical Exam  Constitutional: He is oriented to person, place, and time. He appears well-developed and well-nourished.  HENT:  Head: Normocephalic and atraumatic.  Eyes: Pupils are equal, round, and reactive to light. EOM are normal.  Neck: No JVD present. Carotid bruit is not present.  Cardiovascular: Normal rate, regular rhythm and normal heart sounds.  No murmur heard. Pulmonary/Chest: Effort normal and breath sounds normal. He has no rales.  Musculoskeletal: He exhibits edema (2+ edema to mid tibia bilaterally).  Neurological: He is alert and oriented to person, place, and time.  Skin: Skin is warm and dry.  Psychiatric: He has a normal mood and affect.  Vitals reviewed.   Vitals:   04/12/18 0942 04/12/18 0943 04/12/18 0948  BP: (!) 180/85 (!) 166/90 (!) 160/88  Pulse: 71    Temp: 98.1 F (36.7 C)    TempSrc: Oral    SpO2: 97%    Weight: 208 lb (94.3 kg)    Height: '5\' 7"'  (1.702 m)     Results for orders placed or performed in visit on 04/12/18  POCT glucose (manual entry)  Result Value Ref Range   POC Glucose 304 (A) 70 - 99 mg/dl      Assessment & Plan:   Steven Benjamin is a 66 y.o. male Type 2 diabetes mellitus with hyperglycemia, with long-term current use of insulin ([place]) - Plan: Basic metabolic panel, POCT glucose (manual entry), metFORMIN (GLUCOPHAGE  XR) 500 MG 24 hr tablet  -Uncontrolled, nausea without vomiting.  Check bicarb, with BMP.  Increase Lantus to 14 units then further increase by 2 units every 3 days until Benjamin sugars remain below 200.    - Diarrhea likely due to metformin, but tolerated 500 mg daily dose.  Will change to extended release metformin 500 mg once per day for now.  -Recheck 1 week, ER precautions if worsening sooner  Essential hypertension - Plan: Basic metabolic panel Peripheral edema  -restarting lasix daily may help with edema as well as Benjamin pressure.  Will recheck in 1 week, monitor outside readings with RTC/ER precautions  Meds ordered this encounter  Medications  . metFORMIN (GLUCOPHAGE XR) 500 MG 24 hr tablet    Sig: Take 1 tablet (500 mg total) by mouth daily with breakfast.    Dispense:  30 tablet    Refill:  1   Patient Instructions     Diarrhea is likely due to the metformin.  Try changing to the 500 mg extended release dose once per day.  Benjamin sugar still too high.  Increase Lantus to 14 units tonight, then every 3 days increase by 2 units until the Benjamin sugars get below 200.  Remain at that dose once your sugars get below 200.   Recheck in 1 week.   Benjamin pressure is elevated today. Restarting lasix once per day may help with the swelling as well as Benjamin pressure. Mmonitor your Benjamin pressure outside of the office if possible and if it remains over 140/90, let me know.  Recheck in 1 week.   If you have lab work done today you will be contacted with your lab results within the next 2 weeks.  If you have not heard from [place] then please contact us. The fastest way to get your results is to register  for My Chart.   IF you received an x-Benjamin today, you will receive an invoice from [facility]. Please contact [facility] at [phone] with questions or concerns regarding your invoice.   IF you received labwork today, you will receive an invoice from [place]. Please contact  [facility] at [phone] with questions or concerns regarding your invoice.   Our billing staff will not be able to assist you with questions regarding bills from these companies.  You will be contacted with the lab results as soon as they are available. The fastest way to get your results is to activate your My Chart account. Instructions are located on the last page of this paperwork. If you have not heard from [place] regarding the results in 2 weeks, please contact this office.       I personally performed the services described in this documentation, which was scribed in my presence. The recorded information has been reviewed and considered for accuracy and completeness, addended by me as needed, and agree with information above.  Signed,   Steven Ray, MD [facility].  04/12/18 10:52 AM

## 2018-04-12 NOTE — Patient Instructions (Addendum)
   Diarrhea is likely due to the metformin.  Try changing to the 500 mg extended release dose once per day.  Blood sugar still too high.  Increase Lantus to 14 units tonight, then every 3 days increase by 2 units until the blood sugars get below 200.  Remain at that dose once your sugars get below 200.   Recheck in 1 week.   Blood pressure is elevated today. Restarting lasix once per day may help with the swelling as well as blood pressure. Mmonitor your blood pressure outside of the office if possible and if it remains over 140/90, let me know.  Recheck in 1 week.   If you have lab work done today you will be contacted with your lab results within the next 2 weeks.  If you have not heard from [place] then please contact us. The fastest way to get your results is to register for My Chart.   IF you received an x-ray today, you will receive an invoice from [facility]. Please contact [facility] at [phone] with questions or concerns regarding your invoice.   IF you received labwork today, you will receive an invoice from [place]. Please contact [facility] at [phone] with questions or concerns regarding your invoice.   Our billing staff will not be able to assist you with questions regarding bills from these companies.  You will be contacted with the lab results as soon as they are available. The fastest way to get your results is to activate your My Chart account. Instructions are located on the last page of this paperwork. If you have not heard from [place] regarding the results in 2 weeks, please contact this office.

## 2018-04-19 ENCOUNTER — Encounter: Payer: Self-pay | Admitting: Family Medicine

## 2018-04-19 ENCOUNTER — Other Ambulatory Visit: Payer: Self-pay

## 2018-04-19 ENCOUNTER — Ambulatory Visit (INDEPENDENT_AMBULATORY_CARE_PROVIDER_SITE_OTHER): Payer: Medicare Other | Admitting: Family Medicine

## 2018-04-19 ENCOUNTER — Telehealth: Payer: Self-pay | Admitting: Family Medicine

## 2018-04-19 VITALS — BP 148/90 | HR 73 | Temp 97.7°F | Ht 67.0 in | Wt 209.4 lb

## 2018-04-19 DIAGNOSIS — R609 Edema, unspecified: Secondary | ICD-10-CM

## 2018-04-19 DIAGNOSIS — Z794 Long term (current) use of insulin: Secondary | ICD-10-CM | POA: Diagnosis not present

## 2018-04-19 DIAGNOSIS — I10 Essential (primary) hypertension: Secondary | ICD-10-CM | POA: Diagnosis not present

## 2018-04-19 DIAGNOSIS — E1165 Type 2 diabetes mellitus with hyperglycemia: Secondary | ICD-10-CM | POA: Diagnosis not present

## 2018-04-19 LAB — GLUCOSE, POCT (MANUAL RESULT ENTRY): POC Glucose: 208 mg/dl — AB (ref 70–99)

## 2018-04-19 MED ORDER — GLUCOSE BLOOD VI STRP: ORAL_STRIP | 4 refills | Status: AC

## 2018-04-19 MED ORDER — BLOOD GLUCOSE METER KIT: PACK | 0 refills | Status: AC

## 2018-04-19 NOTE — Patient Instructions (Addendum)
Call and let me know what dose of insulin you are currently taking.  Recheck in 2 weeks.   Compression stockings may help with leg swelling. Continue lasix same dose for now.   Keep a record of your blood pressures outside of the office and if running over 140/90, w may need to look at other med changes.    Peripheral Edema Peripheral edema is swelling that is caused by a buildup of fluid. Peripheral edema most often affects the lower legs, ankles, and feet. It can also develop in the arms, hands, and face. The area of the body that has peripheral edema will look swollen. It may also feel heavy or warm. Your clothes may start to feel tight. Pressing on the area may make a temporary dent in your skin. You may not be able to move your arm or leg as much as usual. There are many causes of peripheral edema. It can be a complication of other diseases, such as congestive heart failure, kidney disease, or a problem with your blood circulation. It also can be a side effect of certain medicines. It often happens to women during pregnancy. Sometimes, the cause is not known. Treating the underlying condition is often the only treatment for peripheral edema. Follow these instructions at home: Pay attention to any changes in your symptoms. Take these actions to help with your discomfort:  Raise (elevate) your legs while you are sitting or lying down.  Move around often to prevent stiffness and to lessen swelling. Do not sit or stand for long periods of time.  Wear support stockings as told by your health care provider.  Follow instructions from your health care provider about limiting salt (sodium) in your diet. Sometimes eating less salt can reduce swelling.  Take over-the-counter and prescription medicines only as told by your health care provider. Your health care provider may prescribe medicine to help your body get rid of excess water (diuretic).  Keep all follow-up visits as told by your health care  provider. This is important.  Contact a health care provider if:  You have a fever.  Your edema starts suddenly or is getting worse, especially if you are pregnant or have a medical condition.  You have swelling in only one leg.  You have increased swelling and pain in your legs. Get help right away if:  You develop shortness of breath, especially when you are lying down.  You have pain in your chest or abdomen.  You feel weak.  You faint. This information is not intended to replace advice given to you by your health care provider. Make sure you discuss any questions you have with your health care provider. Document Released: 07/28/2004 Document Revised: 11/23/2015 Document Reviewed: 12/31/2014 Elsevier Interactive Patient Education  [employer].   If you have lab work done today you will be contacted with your lab results within the next 2 weeks.  If you have not heard from [place] then please contact us. The fastest way to get your results is to register for My Chart.   IF you received an x-ray today, you will receive an invoice from [facility]. Please contact [facility] Radiology at [phone] with questions or concerns regarding your invoice.   IF you received labwork today, you will receive an invoice from [place]. Please contact [facility] at [phone] with questions or concerns regarding your invoice.   Our billing staff will not be able to assist you with questions regarding bills from these companies.  You  will be contacted with the lab results as soon as they are available. The fastest way to get your results is to activate your My Chart account. Instructions are located on the last page of this paperwork. If you have not heard from [place] regarding the results in 2 weeks, please contact this office.

## 2018-04-19 NOTE — Progress Notes (Signed)
Subjective:    Patient ID: Steven Benjamin, male    DOB: December 06, 1950, 67 y.o.   MRN: [id number]  HPI Steven Benjamin is a 67 y.o. male Presents today for: Chief Complaint  Patient presents with  . Hypertension  . Diabetes   Diabetes: See prior notes.  A1c elevated at 11.9 on October 1.  Start on Lantus 10 units nightly, metformin 500 mg twice daily.  Normal bicarb, glucose 449 at that time.  Lantus was increased to 12 units nightly. At last visit on October 10, was having some nausea and diarrhea no vomiting.  Metformin anywhere from 2 to 3 days at a time twice per day than once a day for 1 day to help tolerate that medication.  Denies any diarrhea with daily dosing.  Home blood sugars at that time were 200-300, in office glucose 304.  Lantus increased to 14 units, then plan for 2 unit increase every 3 days until readings below 200.   Unsure of how many units he is using at night, but now on same dose past few nights. - verified after OV - on 16 units.  No symptomatic lows.  Home readings around 150 No return of diarrhea at QD metformin dosing.   Hypertension with peripheral edema: He has been taking Lasix every other day at last visit.  Denied any other missed doses of medications.  Toprol has been increased to 100 mg daily on October 1, continued on Maxide and lisinopril the same dose at that point.  Lasix increased to daily after last visit for improved swelling and blood pressure control.  Taking Maxzide, toprol, lisinopril all once per day.  Taking lasix daily excpet Wednesday and Sunday due to church those days, and need to use bathroom. Has not yet taken today.  Not adding salt to food. Canned food at times, but draining it.  Avoiding frozen foods for the most part. No alcohol/beer.  appt with cardiology in December.  BP at [place] normal.    Lab Results  Component Value Date   CREATININE 1.18 04/12/2018   BP Readings from Last 3 Encounters:  04/19/18 (!) 148/90    04/12/18 (!) 160/88  04/06/18 (!) 144/75  no home readings.     Patient Active Problem List   Diagnosis Date Noted  . Chronic diastolic heart failure ([place]) 06/07/2017  . ACUTE BRONCHITIS 08/11/2007  . HYPERLIPIDEMIA 09/27/2006  . ANEMIA-IRON DEFICIENCY 09/27/2006  . Essential hypertension 09/27/2006  . ALLERGIC RHINITIS 09/27/2006  . DIVERTICULOSIS, COLON 09/27/2006  . COLONIC POLYPS, HX OF 09/27/2006   Past Medical History:  Diagnosis Date  . Hypertension   . Kidney stone    No past surgical history on file. Allergies  Allergen Reactions  . Lipitor [Atorvastatin Calcium] Other (See Comments)    Muscle pains  . Vibra-Tab [Doxycycline] Other (See Comments)    Muscle pain   . Z-Pak [Azithromycin]   . Erythromycin Rash   Prior to Admission medications   Medication Sig Start Date End Date Taking? Authorizing Provider  blood glucose meter kit and supplies Dispense based on patient and insurance preference. Use up to four times daily as directed. (FOR ICD-10 E10.9, E11.9). 04/07/18  Yes [name], MD  cetirizine (ZYRTEC) 10 MG tablet Take 10 mg by mouth daily. Reported on 07/07/2015   Yes [provider]  furosemide (LASIX) 20 MG tablet Take 1 tablet (20 mg total) by mouth daily as needed. 04/03/18  Yes [name], MD  glucose blood test strip Use as instructed, up to 3 times per day. DM2, uncontrolled with hyperglycemia, insulin dependent. 04/06/18  Yes [name], MD  Insulin Glargine (LANTUS SOLOSTAR) 100 UNIT/ML Solostar Pen Inject 10 Units into the skin daily. 04/03/18  Yes [name], MD  lisinopril (PRINIVIL,ZESTRIL) 40 MG tablet TAKE 1 TABLET(40 MG) BY MOUTH DAILY 04/03/18  Yes [name], MD  metFORMIN (GLUCOPHAGE XR) 500 MG 24 hr tablet Take 1 tablet (500 mg total) by mouth daily with breakfast. 04/12/18  Yes [name], MD  metoprolol succinate (TOPROL-XL) 100 MG 24 hr tablet Take 1 tablet (100 mg total) by mouth daily.  Take with or immediately following a meal. 04/03/18  Yes [name], MD  sertraline (ZOLOFT) 25 MG tablet Take 1 tablet (25 mg total) by mouth daily. 04/03/18  Yes [name], MD  simvastatin (ZOCOR) 20 MG tablet Take 1 tablet (20 mg total) by mouth at bedtime. 04/03/18  Yes [name], MD  triamterene-hydrochlorothiazide (MAXZIDE-25) 37.5-25 MG tablet Take 1 tablet by mouth daily. 04/03/18  Yes [name], MD   Social History   Socioeconomic History  . Marital status: Married    Spouse name: Not on file  . Number of children: Not on file  . Years of education: Not on file  . Highest education level: Not on file  Occupational History  . Not on file  Social Needs  . Financial resource strain: Not on file  . Food insecurity:    Worry: Not on file    Inability: Not on file  . Transportation needs:    Medical: Not on file    Non-medical: Not on file  Tobacco Use  . Smoking status: Former Research scientist (life sciences)  . Smokeless tobacco: Never Used  Substance and Sexual Activity  . Alcohol use: No  . Drug use: No  . Sexual activity: Not on file  Lifestyle  . Physical activity:    Days per week: Not on file    Minutes per session: Not on file  . Stress: Not on file  Relationships  . Social connections:    Talks on phone: Not on file    Gets together: Not on file    Attends religious service: Not on file    Active member of club or organization: Not on file    Attends meetings of clubs or organizations: Not on file    Relationship status: Not on file  . Intimate partner violence:    Fear of current or ex partner: Not on file    Emotionally abused: Not on file    Physically abused: Not on file    Forced sexual activity: Not on file  Other Topics Concern  . Not on file  Social History Narrative  . Not on file    Review of Systems     Objective:   Physical Exam  Constitutional: He is oriented to person, place, and time. He appears well-developed and well-nourished.   HENT:  Head: Normocephalic and atraumatic.  Eyes: Pupils are equal, round, and reactive to light. EOM are normal.  Neck: No JVD present. Carotid bruit is not present.  Cardiovascular: Normal rate, regular rhythm and normal heart sounds.  No murmur heard. Pulmonary/Chest: Effort normal and breath sounds normal. He has no rales.  Musculoskeletal: He exhibits edema (1-2+ pedal, no ulceration/rash. ).  Neurological: He is alert and oriented to person, place, and time.  Skin: Skin is warm and dry.  Psychiatric: He has a normal mood and affect.  Vitals reviewed.  Vitals:   04/19/18 1143 04/19/18 1145  BP: (!) 182/98 (!) 148/90  Pulse: 73   Temp: 97.7 F (36.5 C)   TempSrc: Oral   SpO2: 96%   Weight: 209 lb 6.4 oz (95 kg)   Height: '5\' 7"'  (1.702 m)    Results for orders placed or performed in visit on 04/19/18  POCT glucose (manual entry)  Result Value Ref Range   POC Glucose 208 (A) 70 - 99 mg/dl       Assessment & Plan:    Steven Benjamin is a 67 y.o. male Type 2 diabetes mellitus with hyperglycemia, with long-term current use of insulin ([place]) - Plan: POCT glucose (manual entry), glucose blood test strip, blood glucose meter kit and supplies  -Tolerating current Lantus dose.  Still slightly elevated, but will recheck in 2 weeks with further home readings, anticipate increased dosing at that time.  Peripheral edema  -Continue Lasix, compression stockings discussed.  Essential hypertension  -Borderline elevated, avoid additional sodium in diet, continue same dose meds for now with repeat testing in 2 weeks.  Meds ordered this encounter  Medications  . glucose blood test strip    Sig: Use as instructed, up to 3 times per day. DM2, uncontrolled with hyperglycemia, insulin dependent.    Dispense:  100 each    Refill:  4  . blood glucose meter kit and supplies    Sig: Dispense based on patient and insurance preference. Use up to four times daily as directed. (FOR ICD-10 E10.9,  E11.9).    Dispense:  1 each    Refill:  0    Order Specific Question:   Number of strips    Answer:   100    Order Specific Question:   Number of lancets    Answer:   100   Patient Instructions   Call and let me know what dose of insulin you are currently taking.  Recheck in 2 weeks.   Compression stockings may help with leg swelling. Continue lasix same dose for now.   Keep a record of your blood pressures outside of the office and if running over 140/90, w may need to look at other med changes.    Peripheral Edema Peripheral edema is swelling that is caused by a buildup of fluid. Peripheral edema most often affects the lower legs, ankles, and feet. It can also develop in the arms, hands, and face. The area of the body that has peripheral edema will look swollen. It may also feel heavy or warm. Your clothes may start to feel tight. Pressing on the area may make a temporary dent in your skin. You may not be able to move your arm or leg as much as usual. There are many causes of peripheral edema. It can be a complication of other diseases, such as congestive heart failure, kidney disease, or a problem with your blood circulation. It also can be a side effect of certain medicines. It often happens to women during pregnancy. Sometimes, the cause is not known. Treating the underlying condition is often the only treatment for peripheral edema. Follow these instructions at home: Pay attention to any changes in your symptoms. Take these actions to help with your discomfort:  Raise (elevate) your legs while you are sitting or lying down.  Move around often to prevent stiffness and to lessen swelling. Do not sit or stand for long periods of time.  Wear support stockings as told by your health care provider.  Follow instructions from your health care provider about limiting salt (sodium) in your diet. Sometimes eating less salt can reduce swelling.  Take over-the-counter and prescription  medicines only as told by your health care provider. Your health care provider may prescribe medicine to help your body get rid of excess water (diuretic).  Keep all follow-up visits as told by your health care provider. This is important.  Contact a health care provider if:  You have a fever.  Your edema starts suddenly or is getting worse, especially if you are pregnant or have a medical condition.  You have swelling in only one leg.  You have increased swelling and pain in your legs. Get help right away if:  You develop shortness of breath, especially when you are lying down.  You have pain in your chest or abdomen.  You feel weak.  You faint. This information is not intended to replace advice given to you by your health care provider. Make sure you discuss any questions you have with your health care provider. Document Released: 07/28/2004 Document Revised: 11/23/2015 Document Reviewed: 12/31/2014 Elsevier Interactive Patient Education  [employer].   If you have lab work done today you will be contacted with your lab results within the next 2 weeks.  If you have not heard from [place] then please contact us. The fastest way to get your results is to register for My Chart.   IF you received an x-ray today, you will receive an invoice from [facility]. Please contact [facility] at [phone] with questions or concerns regarding your invoice.   IF you received labwork today, you will receive an invoice from [facility] [facility]. Please contact [facility] at [phone] with questions or concerns regarding your invoice.   Our billing staff will not be able to assist you with questions regarding bills from these companies.  You will be contacted with the lab results as soon as they are available. The fastest way to get your results is to activate your My Chart account. Instructions are located on the last page of this paperwork. If you have not heard from [place] regarding  the results in 2 weeks, please contact this office.       I personally performed the services described in this documentation, which was scribed in my presence. The recorded information has been reviewed and considered for accuracy and completeness, addended by me as needed, and agree with information above.  Signed,   [name], MD [facility] [place].  04/21/18 10:39 AM

## 2018-04-19 NOTE — Telephone Encounter (Signed)
Copied from [place] [phone]. Topic: Quick Communication - See Telephone Encounter >> Apr 19, 2018  1:53 PM Bea Graff, NT wrote: CRM for notification. See Telephone encounter for: 04/19/18. Pt calling and states Dr. Carlota Raspberry needed to know how many units of insulin the pt is taking. Pt is taking 16 units.

## 2018-04-19 NOTE — Telephone Encounter (Signed)
Noted  

## 2018-04-21 ENCOUNTER — Encounter: Payer: Self-pay | Admitting: Family Medicine

## 2018-05-04 ENCOUNTER — Other Ambulatory Visit: Payer: Self-pay

## 2018-05-04 ENCOUNTER — Ambulatory Visit (INDEPENDENT_AMBULATORY_CARE_PROVIDER_SITE_OTHER): Payer: Medicare Other | Admitting: Family Medicine

## 2018-05-04 ENCOUNTER — Encounter: Payer: Self-pay | Admitting: Family Medicine

## 2018-05-04 VITALS — BP 148/90 | HR 67 | Temp 97.3°F | Ht 67.0 in | Wt 207.4 lb

## 2018-05-04 DIAGNOSIS — Z794 Long term (current) use of insulin: Secondary | ICD-10-CM

## 2018-05-04 DIAGNOSIS — E1165 Type 2 diabetes mellitus with hyperglycemia: Secondary | ICD-10-CM | POA: Diagnosis not present

## 2018-05-04 LAB — GLUCOSE, POCT (MANUAL RESULT ENTRY): POC Glucose: 150 mg/dl — AB (ref 70–99)

## 2018-05-04 NOTE — Progress Notes (Signed)
Subjective:  By signing my name below, I, Steven Benjamin, attest that this documentation has been prepared under the direction and in the presence of [name], MD. Electronically Signed: Moises Benjamin, [place]. 05/04/2018 , 12:35 PM .  Patient was seen in Room 8 .   Patient ID: Steven Benjamin, male    DOB: 05/05/1951, 67 y.o.   MRN: [id number] Chief Complaint  Patient presents with  . Diabetes    F/u    HPI Steven Benjamin is a 67 y.o. male Here for follow up of diabetes. He states he's been doing well.   Diabetes See last office visit on Oct 17th. He did have a significantly elevated A1C at 11.9 on Oct 1st. He was started on Lantus at that time and was on metformin 500 mg bid. At last visit, he was on 16 units daily with home readings around 150. Metformin had been changed to qd dosing with improved tolerance, prior diarrhea.   He reports his Benjamin sugar has been doing well, fasting sugars running around 107, and depending what he eats up to 180-200; though he notes, mostly less than 200. He's been taking Lantus 16 units daily. He denies diarrhea with metformin. He denies any symptomatic lows, feeling shaky or lightheadedness. He denies chest pain, shortness of breath, dizziness, Benjamin in stool or abdominal.   Lab Results  Component Value Date   HGBA1C 11.9 (A) 04/03/2018   Wt Readings from Last 3 Encounters:  05/04/18 207 lb 6.4 oz (94.1 kg)  04/19/18 209 lb 6.4 oz (95 kg)  04/12/18 208 lb (94.3 kg)    HTN with peripheral edema See last office visit. He is on Lasix qd, except for Wednesdays and Sundays due to church as need to use bathroom. He has an appointment with cardiology next month. He checks his BP at [facility], reportedly normal. Recommended compression stockings in addition to Lasix.   He's been checking his BP at [facility], running around 140s/80s. He's been starting up exercising with walking as weather has been cooling down. He wears his compression stockings, but not  as much recently. His cardiology visit is scheduled for Dec 10th.   BP Readings from Last 3 Encounters:  05/04/18 (!) 148/90  04/19/18 (!) 148/90  04/12/18 (!) 160/88    Patient Active Problem List   Diagnosis Date Noted  . Chronic diastolic heart failure ([place]) 06/07/2017  . ACUTE BRONCHITIS 08/11/2007  . HYPERLIPIDEMIA 09/27/2006  . ANEMIA-IRON DEFICIENCY 09/27/2006  . Essential hypertension 09/27/2006  . ALLERGIC RHINITIS 09/27/2006  . DIVERTICULOSIS, COLON 09/27/2006  . COLONIC POLYPS, HX OF 09/27/2006   Past Medical History:  Diagnosis Date  . Hypertension   . Kidney stone    No past surgical history on file. Allergies  Allergen Reactions  . Lipitor [Atorvastatin Calcium] Other (See Comments)    Muscle pains  . Vibra-Tab [Doxycycline] Other (See Comments)    Muscle pain   . Z-Pak [Azithromycin]   . Erythromycin Rash   Prior to Admission medications   Medication Sig Start Date End Date Taking? Authorizing Provider  Benjamin glucose meter kit and supplies Dispense based on patient and insurance preference. Use up to four times daily as directed. (FOR ICD-10 E10.9, E11.9). 04/19/18   [name], MD  cetirizine (ZYRTEC) 10 MG tablet Take 10 mg by mouth daily. Reported on 07/07/2015    [provider]  furosemide (LASIX) 20 MG tablet Take 1 tablet (20 mg total) by mouth daily as needed. 04/03/18  [name], MD  glucose Benjamin test strip Use as instructed, up to 3 times per day. DM2, uncontrolled with hyperglycemia, insulin dependent. 04/19/18   [name], MD  Insulin Glargine (LANTUS SOLOSTAR) 100 UNIT/ML Solostar Pen Inject 10 Units into the skin daily. 04/03/18   [name], MD  lisinopril (PRINIVIL,ZESTRIL) 40 MG tablet TAKE 1 TABLET(40 MG) BY MOUTH DAILY 04/03/18   [name], MD  metFORMIN (GLUCOPHAGE XR) 500 MG 24 hr tablet Take 1 tablet (500 mg total) by mouth daily with breakfast. 04/12/18   [name], MD  metoprolol  succinate (TOPROL-XL) 100 MG 24 hr tablet Take 1 tablet (100 mg total) by mouth daily. Take with or immediately following a meal. 04/03/18   [name], MD  sertraline (ZOLOFT) 25 MG tablet Take 1 tablet (25 mg total) by mouth daily. 04/03/18   [name], MD  simvastatin (ZOCOR) 20 MG tablet Take 1 tablet (20 mg total) by mouth at bedtime. 04/03/18   [name], MD  triamterene-hydrochlorothiazide (MAXZIDE-25) 37.5-25 MG tablet Take 1 tablet by mouth daily. 04/03/18   [name], MD   Social History   Socioeconomic History  . Marital status: Married    Spouse name: Not on file  . Number of children: Not on file  . Years of education: Not on file  . Highest education level: Not on file  Occupational History  . Not on file  Social Needs  . Financial resource strain: Not on file  . Food insecurity:    Worry: Not on file    Inability: Not on file  . Transportation needs:    Medical: Not on file    Non-medical: Not on file  Tobacco Use  . Smoking status: Former Research scientist (life sciences)  . Smokeless tobacco: Never Used  Substance and Sexual Activity  . Alcohol use: No  . Drug use: No  . Sexual activity: Not on file  Lifestyle  . Physical activity:    Days per week: Not on file    Minutes per session: Not on file  . Stress: Not on file  Relationships  . Social connections:    Talks on phone: Not on file    Gets together: Not on file    Attends religious service: Not on file    Active member of club or organization: Not on file    Attends meetings of clubs or organizations: Not on file    Relationship status: Not on file  . Intimate partner violence:    Fear of current or ex partner: Not on file    Emotionally abused: Not on file    Physically abused: Not on file    Forced sexual activity: Not on file  Other Topics Concern  . Not on file  Social History Narrative  . Not on file   Review of Systems  Constitutional: Negative for fatigue and unexpected weight change.   Eyes: Negative for visual disturbance.  Respiratory: Negative for cough, chest tightness and shortness of breath.   Cardiovascular: Negative for chest pain, palpitations and leg swelling.  Gastrointestinal: Negative for abdominal pain, Benjamin in stool, diarrhea, nausea and vomiting.  Neurological: Negative for dizziness, light-headedness and headaches.       Objective:   Physical Exam  Constitutional: He is oriented to person, place, and time. He appears well-developed and well-nourished.  HENT:  Head: Normocephalic and atraumatic.  Eyes: Pupils are equal, round, and reactive to light. EOM are normal.  Neck: No JVD present. Carotid bruit is not present.  Cardiovascular: Normal rate, regular rhythm and normal heart sounds. Exam reveals no gallop and no friction rub.  No murmur heard. Pulmonary/Chest: Effort normal and breath sounds normal. He has no rales.  Musculoskeletal: He exhibits edema.  2+ pedal edema to mid tibia bilaterally  Neurological: He is alert and oriented to person, place, and time.  Skin: Skin is warm and dry.  Psychiatric: He has a normal mood and affect.  Vitals reviewed.   Vitals:   05/04/18 1156 05/04/18 1200  BP: (!) 167/98 (!) 148/90  Pulse: 67   Temp: (!) 97.3 F (36.3 C)   TempSrc: Oral   SpO2: 97%   Weight: 207 lb 6.4 oz (94.1 kg)   Height: '5\' 7"'$  (1.702 m)    Results for orders placed or performed in visit on 05/04/18  POCT glucose (manual entry)  Result Value Ref Range   POC Glucose 150 (A) 70 - 99 mg/dl       Assessment & Plan:   Steven Benjamin is a 67 y.o. male Type 2 diabetes mellitus with hyperglycemia, with long-term current use of insulin ([place]) - Plan: POCT glucose (manual entry)  -Improving control, continue same doses of medication for now.  If persistent spikes at postprandials, may need mealtime insulin coverage.  Recheck 1 month.  -Recommended some form of compression stockings for peripheral edema, continue follow-up as planned  with cardiology.  No orders of the defined types were placed in this encounter.  Patient Instructions    I'm glad to hear about the lower fasting sugars. See info on food below.  If sugars still run close to 200 with meals, we may need to look at mealtime insulin. No change in lantus dose for now.   Continue lasix, but I would recommend some form of compression stockings for legs.   Recheck in 1 month. Return to the clinic or go to the nearest emergency room if any of your symptoms worsen or new symptoms occur.    Diabetes Mellitus and Nutrition When you have diabetes (diabetes mellitus), it is very important to have healthy eating habits because your Benjamin sugar (glucose) levels are greatly affected by what you eat and drink. Eating healthy foods in the appropriate amounts, at about the same times every day, can help you:  Control your Benjamin glucose.  Lower your risk of heart disease.  Improve your Benjamin pressure.  Reach or maintain a healthy weight.  Every person with diabetes is different, and each person has different needs for a meal plan. Your health care provider may recommend that you work with a diet and nutrition specialist (dietitian) to make a meal plan that is best for you. Your meal plan may vary depending on factors such as:  The calories you need.  The medicines you take.  Your weight.  Your Benjamin glucose, Benjamin pressure, and cholesterol levels.  Your activity level.  Other health conditions you have, such as heart or kidney disease.  How do carbohydrates affect me? Carbohydrates affect your Benjamin glucose level more than any other type of food. Eating carbohydrates naturally increases the amount of glucose in your Benjamin. Carbohydrate counting is a method for keeping track of how many carbohydrates you eat. Counting carbohydrates is important to keep your Benjamin glucose at a healthy level, especially if you use insulin or take certain oral diabetes  medicines. It is important to know how many carbohydrates you can safely have in each  meal. This is different for every person. Your dietitian can help you calculate how many carbohydrates you should have at each meal and for snack. Foods that contain carbohydrates include:  Bread, cereal, rice, pasta, and crackers.  Potatoes and corn.  Peas, beans, and lentils.  Milk and yogurt.  Fruit and juice.  Desserts, such as cakes, cookies, ice cream, and candy.  How does alcohol affect me? Alcohol can cause a sudden decrease in Benjamin glucose (hypoglycemia), especially if you use insulin or take certain oral diabetes medicines. Hypoglycemia can be a life-threatening condition. Symptoms of hypoglycemia (sleepiness, dizziness, and confusion) are similar to symptoms of having too much alcohol. If your health care provider says that alcohol is safe for you, follow these guidelines:  Limit alcohol intake to no more than 1 drink per day for nonpregnant women and 2 drinks per day for men. One drink equals 12 oz of beer, 5 oz of wine, or 1 oz of hard liquor.  Do not drink on an empty stomach.  Keep yourself hydrated with water, diet soda, or unsweetened iced tea.  Keep in mind that regular soda, juice, and other mixers may contain a lot of sugar and must be counted as carbohydrates.  What are tips for following this plan? Reading food labels  Start by checking the serving size on the label. The amount of calories, carbohydrates, fats, and other nutrients listed on the label are based on one serving of the food. Many foods contain more than one serving per package.  Check the total grams (g) of carbohydrates in one serving. You can calculate the number of servings of carbohydrates in one serving by dividing the total carbohydrates by 15. For example, if a food has 30 g of total carbohydrates, it would be equal to 2 servings of carbohydrates.  Check the number of grams (g) of saturated and trans  fats in one serving. Choose foods that have low or no amount of these fats.  Check the number of milligrams (mg) of sodium in one serving. Most people should limit total sodium intake to less than 2,300 mg per day.  Always check the nutrition information of foods labeled as "low-fat" or "nonfat". These foods may be higher in added sugar or refined carbohydrates and should be avoided.  Talk to your dietitian to identify your daily goals for nutrients listed on the label. Shopping  Avoid buying canned, premade, or processed foods. These foods tend to be high in fat, sodium, and added sugar.  Shop around the outside edge of the grocery store. This includes fresh fruits and vegetables, bulk grains, fresh meats, and fresh dairy. Cooking  Use low-heat cooking methods, such as baking, instead of high-heat cooking methods like deep frying.  Cook using healthy oils, such as olive, canola, or sunflower oil.  Avoid cooking with butter, cream, or high-fat meats. Meal planning  Eat meals and snacks regularly, preferably at the same times every day. Avoid going long periods of time without eating.  Eat foods high in fiber, such as fresh fruits, vegetables, beans, and whole grains. Talk to your dietitian about how many servings of carbohydrates you can eat at each meal.  Eat 4-6 ounces of lean protein each day, such as lean meat, chicken, fish, eggs, or tofu. 1 ounce is equal to 1 ounce of meat, chicken, or fish, 1 egg, or 1/4 cup of tofu.  Eat some foods each day that contain healthy fats, such as avocado, nuts, seeds, and fish. Lifestyle  Check your Benjamin glucose regularly.  Exercise at least 30 minutes 5 or more days each week, or as told by your health care provider.  Take medicines as told by your health care provider.  Do not use any products that contain nicotine or tobacco, such as cigarettes and e-cigarettes. If you need help quitting, ask your health care provider.  Work with a  Social worker or diabetes educator to identify strategies to manage stress and any emotional and social challenges. What are some questions to ask my health care provider?  Do I need to meet with a diabetes educator?  Do I need to meet with a dietitian?  What number can I call if I have questions?  When are the best times to check my Benjamin glucose? Where to find more information:  American Diabetes Association: diabetes.org/food-and-fitness/food  Academy of Nutrition and Dietetics: PokerClues.dk  [employer] of Diabetes and Digestive and Kidney Diseases (NIH): ContactWire.be Summary  A healthy meal plan will help you control your Benjamin glucose and maintain a healthy lifestyle.  Working with a diet and nutrition specialist (dietitian) can help you make a meal plan that is best for you.  Keep in mind that carbohydrates and alcohol have immediate effects on your Benjamin glucose levels. It is important to count carbohydrates and to use alcohol carefully. This information is not intended to replace advice given to you by your health care provider. Make sure you discuss any questions you have with your health care provider. Document Released: 03/17/2005 Document Revised: 07/25/2016 Document Reviewed: 07/25/2016 Elsevier Interactive Patient Education  [employer].   If you have lab work done today you will be contacted with your lab results within the next 2 weeks.  If you have not heard from [place] then please contact us. The fastest way to get your results is to register for My Chart.   IF you received an x-ray today, you will receive an invoice from [facility]. Please contact [facility] at [phone] with questions or concerns regarding your invoice.   IF you received labwork today, you will receive an invoice from [place]. Please contact  [facility] at [phone] with questions or concerns regarding your invoice.   Our billing staff will not be able to assist you with questions regarding bills from these companies.  You will be contacted with the lab results as soon as they are available. The fastest way to get your results is to activate your My Chart account. Instructions are located on the last page of this paperwork. If you have not heard from [place] regarding the results in 2 weeks, please contact this office.       I personally performed the services described in this documentation, which was scribed in my presence. The recorded information has been reviewed and considered for accuracy and completeness, addended by me as needed, and agree with information above.  Signed,   [name], MD [facility] [place].  05/06/18 10:20 AM

## 2018-05-04 NOTE — Patient Instructions (Addendum)
I'm glad to hear about the lower fasting sugars. See info on food below.  If sugars still run close to 200 with meals, we may need to look at mealtime insulin. No change in lantus dose for now.   Continue lasix, but I would recommend some form of compression stockings for legs.   Recheck in 1 month. Return to the clinic or go to the nearest emergency room if any of your symptoms worsen or new symptoms occur.    Diabetes Mellitus and Nutrition When you have diabetes (diabetes mellitus), it is very important to have healthy eating habits because your blood sugar (glucose) levels are greatly affected by what you eat and drink. Eating healthy foods in the appropriate amounts, at about the same times every day, can help you:  Control your blood glucose.  Lower your risk of heart disease.  Improve your blood pressure.  Reach or maintain a healthy weight.  Every person with diabetes is different, and each person has different needs for a meal plan. Your health care provider may recommend that you work with a diet and nutrition specialist (dietitian) to make a meal plan that is best for you. Your meal plan may vary depending on factors such as:  The calories you need.  The medicines you take.  Your weight.  Your blood glucose, blood pressure, and cholesterol levels.  Your activity level.  Other health conditions you have, such as heart or kidney disease.  How do carbohydrates affect me? Carbohydrates affect your blood glucose level more than any other type of food. Eating carbohydrates naturally increases the amount of glucose in your blood. Carbohydrate counting is a method for keeping track of how many carbohydrates you eat. Counting carbohydrates is important to keep your blood glucose at a healthy level, especially if you use insulin or take certain oral diabetes medicines. It is important to know how many carbohydrates you can safely have in each meal. This is different for every  person. Your dietitian can help you calculate how many carbohydrates you should have at each meal and for snack. Foods that contain carbohydrates include:  Bread, cereal, rice, pasta, and crackers.  Potatoes and corn.  Peas, beans, and lentils.  Milk and yogurt.  Fruit and juice.  Desserts, such as cakes, cookies, ice cream, and candy.  How does alcohol affect me? Alcohol can cause a sudden decrease in blood glucose (hypoglycemia), especially if you use insulin or take certain oral diabetes medicines. Hypoglycemia can be a life-threatening condition. Symptoms of hypoglycemia (sleepiness, dizziness, and confusion) are similar to symptoms of having too much alcohol. If your health care provider says that alcohol is safe for you, follow these guidelines:  Limit alcohol intake to no more than 1 drink per day for nonpregnant women and 2 drinks per day for men. One drink equals 12 oz of beer, 5 oz of wine, or 1 oz of hard liquor.  Do not drink on an empty stomach.  Keep yourself hydrated with water, diet soda, or unsweetened iced tea.  Keep in mind that regular soda, juice, and other mixers may contain a lot of sugar and must be counted as carbohydrates.  What are tips for following this plan? Reading food labels  Start by checking the serving size on the label. The amount of calories, carbohydrates, fats, and other nutrients listed on the label are based on one serving of the food. Many foods contain more than one serving per package.  Check the total grams (g)  of carbohydrates in one serving. You can calculate the number of servings of carbohydrates in one serving by dividing the total carbohydrates by 15. For example, if a food has 30 g of total carbohydrates, it would be equal to 2 servings of carbohydrates.  Check the number of grams (g) of saturated and trans fats in one serving. Choose foods that have low or no amount of these fats.  Check the number of milligrams (mg) of sodium  in one serving. Most people should limit total sodium intake to less than 2,300 mg per day.  Always check the nutrition information of foods labeled as "low-fat" or "nonfat". These foods may be higher in added sugar or refined carbohydrates and should be avoided.  Talk to your dietitian to identify your daily goals for nutrients listed on the label. Shopping  Avoid buying canned, premade, or processed foods. These foods tend to be high in fat, sodium, and added sugar.  Shop around the outside edge of the grocery store. This includes fresh fruits and vegetables, bulk grains, fresh meats, and fresh dairy. Cooking  Use low-heat cooking methods, such as baking, instead of high-heat cooking methods like deep frying.  Cook using healthy oils, such as olive, canola, or sunflower oil.  Avoid cooking with butter, cream, or high-fat meats. Meal planning  Eat meals and snacks regularly, preferably at the same times every day. Avoid going long periods of time without eating.  Eat foods high in fiber, such as fresh fruits, vegetables, beans, and whole grains. Talk to your dietitian about how many servings of carbohydrates you can eat at each meal.  Eat 4-6 ounces of lean protein each day, such as lean meat, chicken, fish, eggs, or tofu. 1 ounce is equal to 1 ounce of meat, chicken, or fish, 1 egg, or 1/4 cup of tofu.  Eat some foods each day that contain healthy fats, such as avocado, nuts, seeds, and fish. Lifestyle   Check your blood glucose regularly.  Exercise at least 30 minutes 5 or more days each week, or as told by your health care provider.  Take medicines as told by your health care provider.  Do not use any products that contain nicotine or tobacco, such as cigarettes and e-cigarettes. If you need help quitting, ask your health care provider.  Work with a Social worker or diabetes educator to identify strategies to manage stress and any emotional and social challenges. What are some  questions to ask my health care provider?  Do I need to meet with a diabetes educator?  Do I need to meet with a dietitian?  What number can I call if I have questions?  When are the best times to check my blood glucose? Where to find more information:  American Diabetes Association: diabetes.org/food-and-fitness/food  Academy of Nutrition and Dietetics: PokerClues.dk  [employer] of Diabetes and Digestive and Kidney Diseases (NIH): ContactWire.be Summary  A healthy meal plan will help you control your blood glucose and maintain a healthy lifestyle.  Working with a diet and nutrition specialist (dietitian) can help you make a meal plan that is best for you.  Keep in mind that carbohydrates and alcohol have immediate effects on your blood glucose levels. It is important to count carbohydrates and to use alcohol carefully. This information is not intended to replace advice given to you by your health care provider. Make sure you discuss any questions you have with your health care provider. Document Released: 03/17/2005 Document Revised: 07/25/2016 Document Reviewed: 07/25/2016 Elsevier  Interactive Patient Education  [employer].   If you have lab work done today you will be contacted with your lab results within the next 2 weeks.  If you have not heard from [place] then please contact us. The fastest way to get your results is to register for My Chart.   IF you received an x-ray today, you will receive an invoice from [facility]. Please contact [facility] Rad[facility] at [phone] with questions or concerns regarding your invoice.   IF you received labwork today, you will receive an invoice from [place]. Please contact [facility] at [phone] with questions or concerns regarding your invoice.   Our billing staff will not be able to assist  you with questions regarding bills from these companies.  You will be contacted with the lab results as soon as they are available. The fastest way to get your results is to activate your My Chart account. Instructions are located on the last page of this paperwork. If you have not heard from [place] regarding the results in 2 weeks, please contact this office.

## 2018-06-08 ENCOUNTER — Other Ambulatory Visit: Payer: Self-pay | Admitting: Family Medicine

## 2018-06-08 ENCOUNTER — Other Ambulatory Visit: Payer: Self-pay

## 2018-06-08 ENCOUNTER — Encounter: Payer: Self-pay | Admitting: Family Medicine

## 2018-06-08 ENCOUNTER — Ambulatory Visit (INDEPENDENT_AMBULATORY_CARE_PROVIDER_SITE_OTHER): Payer: Medicare Other | Admitting: Family Medicine

## 2018-06-08 VITALS — BP 162/88 | HR 63 | Temp 98.2°F | Resp 20 | Ht 65.55 in | Wt 209.4 lb

## 2018-06-08 DIAGNOSIS — Z794 Long term (current) use of insulin: Secondary | ICD-10-CM

## 2018-06-08 DIAGNOSIS — E1165 Type 2 diabetes mellitus with hyperglycemia: Secondary | ICD-10-CM

## 2018-06-08 LAB — GLUCOSE, POCT (MANUAL RESULT ENTRY): POC GLUCOSE: 165 mg/dL — AB (ref 70–99)

## 2018-06-08 MED ORDER — METFORMIN HCL ER 500 MG PO TB24: 500.0000 mg | ORAL_TABLET | Freq: Every day | ORAL | 1 refills | Status: AC

## 2018-06-08 MED ORDER — INSULIN GLARGINE 100 UNIT/ML SOLOSTAR PEN: 16.0000 [IU] | PEN_INJECTOR | Freq: Every day | SUBCUTANEOUS | 2 refills | Status: DC

## 2018-06-08 MED ORDER — EMPAGLIFLOZIN 10 MG PO TABS: 10.0000 mg | ORAL_TABLET | Freq: Every day | ORAL | 2 refills | Status: DC

## 2018-06-08 NOTE — Patient Instructions (Addendum)
  New med jardiance may help blood sugar control. Continue same dose lantus and metformin for now.  Watch for low blood sugar symptoms as we have discussed in the past. If those occur, we may need to change your meds.if med too expensive (can try discount card), let me know.   Blood pressure elevated here, but based on home readings can wait to discuss any changes with your cardiologist in a few days.  Recheck with me in 1 month for repeat A1c testing.  Let me know if there are questions in the meantime.   If you have lab work done today you will be contacted with your lab results within the next 2 weeks.  If you have not heard from [place] then please contact us. The fastest way to get your results is to register for My Chart.   IF you received an x-ray today, you will receive an invoice from [facility]. Please contact [facility]iology at [phone] with questions or concerns regarding your invoice.   IF you received labwork today, you will receive an invoice from [place]. Please contact [facility] at [phone] with questions or concerns regarding your invoice.   Our billing staff will not be able to assist you with questions regarding bills from these companies.  You will be contacted with the lab results as soon as they are available. The fastest way to get your results is to activate your My Chart account. Instructions are located on the last page of this paperwork. If you have not heard from [place] regarding the results in 2 weeks, please contact this office.

## 2018-06-08 NOTE — Progress Notes (Signed)
Subjective:    Patient ID: Steven Benjamin, male    DOB: 03-29-1951, 67 y.o.   MRN: [id number]  HPI Steven Benjamin is a 67 y.o. male Presents today for: Chief Complaint  Patient presents with  . Diabetes    f/u  . Medication Refill    metformin   Diabetes: Lab Results  Component Value Date   HGBA1C 11.9 (A) 04/03/2018   HGBA1C 7.4 (H) 01/01/2018   HGBA1C 7.2 (H) 09/25/2017   Lab Results  Component Value Date   LDLCALC 89 01/01/2018   CREATININE 1.18 04/12/2018  Elevated A1c of 11.9 on October 1.  Started on Lantus and was on metformin 500 mg twice daily, then once per day due to diarrhea with BID dosing.  Most recent visit November 1.  On Lantus 16 units daily at that time, no diarrhea with metformin.  Denied symptomatic lows.  Home blood sugars were around 107 all the way up to 200 depending on diet.  In office glucose of 150, handout given on diabetes and nutrition.  Continued on Lantus 16 units/day, metformin 500 mg ER QD.   Still on lantus 16 units per day. metformin 554m once per day. Rare diarrhea.  Fasting readings 107.  No symptomatic lows.  2hr postprandial 200-215 after dinner,  High 100's on lunch/BF.  Has tried to cut back on sugar and carbs in diet.  Renal function ok prior. No hx of pancreatitis. No thyroid cancer/endocrine CA in family.   Hypertension: BP Readings from Last 3 Encounters:  06/08/18 (!) 162/88  05/04/18 (!) 148/90  04/19/18 (!) 148/90   Lab Results  Component Value Date   CREATININE 1.18 04/12/2018  no missed doses of meds. No new cp/dyspnea, HA.  Using compression stockings for LE swelling.  Has appt with cardiology in 4 days.  [facility]   Patient Active Problem List   Diagnosis Date Noted  . Chronic diastolic heart failure ([place] 06/07/2017  . ACUTE BRONCHITIS 08/11/2007  . HYPERLIPIDEMIA 09/27/2006  . ANEMIA-IRON DEFICIENCY 09/27/2006  . Essential hypertension 09/27/2006  . ALLERGIC RHINITIS 09/27/2006  .  DIVERTICULOSIS, COLON 09/27/2006  . COLONIC POLYPS, HX OF 09/27/2006   Past Medical History:  Diagnosis Date  . Hypertension   . Kidney stone    History reviewed. No pertinent surgical history. Allergies  Allergen Reactions  . Lipitor [Atorvastatin Calcium] Other (See Comments)    Muscle pains  . Vibra-Tab [Doxycycline] Other (See Comments)    Muscle pain   . Z-Pak [Azithromycin]   . Erythromycin Rash   Prior to Admission medications   Medication Sig Start Date End Date Taking? Authorizing Provider  blood glucose meter kit and supplies Dispense based on patient and insurance preference. Use up to four times daily as directed. (FOR ICD-10 E10.9, E11.9). 04/19/18  Yes GWendie Agreste MD  cetirizine (ZYRTEC) 10 MG tablet Take 10 mg by mouth daily. Reported on 07/07/2015   Yes [provider]  furosemide (LASIX) 20 MG tablet Take 1 tablet (20 mg total) by mouth daily as needed. 04/03/18  Yes GWendie Agreste MD  glucose blood test strip Use as instructed, up to 3 times per day. DM2, uncontrolled with hyperglycemia, insulin dependent. 04/19/18  Yes GWendie Agreste MD  Insulin Glargine (LANTUS SOLOSTAR) 100 UNIT/ML Solostar Pen Inject 10 Units into the skin daily. 04/03/18  Yes GWendie Agreste MD  lisinopril (PRINIVIL,ZESTRIL) 40 MG tablet TAKE 1 TABLET(40 MG) BY MOUTH DAILY  04/03/18  Yes [name], MD  metFORMIN (GLUCOPHAGE XR) 500 MG 24 hr tablet Take 1 tablet (500 mg total) by mouth daily with breakfast. 04/12/18  Yes [name], MD  metoprolol succinate (TOPROL-XL) 100 MG 24 hr tablet Take 1 tablet (100 mg total) by mouth daily. Take with or immediately following a meal. 04/03/18  Yes [name], MD  sertraline (ZOLOFT) 25 MG tablet Take 1 tablet (25 mg total) by mouth daily. 04/03/18  Yes [name], MD  simvastatin (ZOCOR) 20 MG tablet Take 1 tablet (20 mg total) by mouth at bedtime. 04/03/18  Yes [name], MD    triamterene-hydrochlorothiazide (MAXZIDE-25) 37.5-25 MG tablet Take 1 tablet by mouth daily. 04/03/18  Yes [name], MD   Social History   Socioeconomic History  . Marital status: Married    Spouse name: Not on file  . Number of children: 2  . Years of education: Not on file  . Highest education level: Not on file  Occupational History  . Not on file  Social Needs  . Financial resource strain: Not on file  . Food insecurity:    Worry: Not on file    Inability: Not on file  . Transportation needs:    Medical: Not on file    Non-medical: Not on file  Tobacco Use  . Smoking status: Former Research scientist (life sciences)  . Smokeless tobacco: Never Used  Substance and Sexual Activity  . Alcohol use: No  . Drug use: No  . Sexual activity: Not on file  Lifestyle  . Physical activity:    Days per week: Not on file    Minutes per session: Not on file  . Stress: Not on file  Relationships  . Social connections:    Talks on phone: Not on file    Gets together: Not on file    Attends religious service: Not on file    Active member of club or organization: Not on file    Attends meetings of clubs or organizations: Not on file    Relationship status: Not on file  . Intimate partner violence:    Fear of current or ex partner: Not on file    Emotionally abused: Not on file    Physically abused: Not on file    Forced sexual activity: Not on file  Other Topics Concern  . Not on file  Social History Narrative  . Not on file    Review of Systems Per HPI.      Objective:   Physical Exam  Constitutional: He is oriented to person, place, and time. He appears well-developed and well-nourished.  HENT:  Head: Normocephalic and atraumatic.  Eyes: Pupils are equal, round, and reactive to light. EOM are normal.  Neck: No JVD present. Carotid bruit is not present.  Cardiovascular: Normal rate, regular rhythm and normal heart sounds.  No murmur heard. Pulmonary/Chest: Effort normal and breath  sounds normal. He has no rales.  Musculoskeletal: He exhibits no edema.  Neurological: He is alert and oriented to person, place, and time.  Skin: Skin is warm and dry.  Psychiatric: He has a normal mood and affect.  Vitals reviewed.   Vitals:   06/08/18 1125 06/08/18 1153  BP: (!) 174/93 (!) 162/88  Pulse: 63   Resp: 20   Temp: 98.2 F (36.8 C)   TempSrc: Oral   SpO2: 97%   Weight: 209 lb 6.4 oz (95 kg)   Height: 5' 5.55" (1.665 m)  Results for orders placed or performed in visit on 06/08/18  POCT glucose (manual entry)  Result Value Ref Range   POC Glucose 165 (A) 70 - 99 mg/dl      Assessment & Plan:   Steven Benjamin is a 67 y.o. male Type 2 diabetes mellitus with hyperglycemia, with long-term current use of insulin ([place]) - Plan: POCT glucose (manual entry), Microalbumin / creatinine urine ratio, empagliflozin (JARDIANCE) 10 MG TABS tablet, metFORMIN (GLUCOPHAGE XR) 500 MG 24 hr tablet Type 2 diabetes mellitus with hyperglycemia, without long-term current use of insulin ([facility]) - Plan: Insulin Glargine (LANTUS SOLOSTAR) 100 UNIT/ML Solostar Pen  -Continue Lantus, metformin.  Start Jardiance for improved control, potential side effects and risk discussed.  Coupon voucher given, but advised me if not covered and can look at other agents in the class  -Hypoglycemic precautions  -Recheck 1 month  -Follow-up with cardiology as planned.  Elevated BP in office, but outside blood pressures are improved.  No change in regimen for now.  -Continue compression stockings for peripheral edema.   Meds ordered this encounter  Medications  . empagliflozin (JARDIANCE) 10 MG TABS tablet    Sig: Take 10 mg by mouth daily.    Dispense:  30 tablet    Refill:  2  . Insulin Glargine (LANTUS SOLOSTAR) 100 UNIT/ML Solostar Pen    Sig: Inject 16 Units into the skin daily.    Dispense:  5 pen    Refill:  2  . metFORMIN (GLUCOPHAGE XR) 500 MG 24 hr tablet    Sig: Take 1 tablet (500 mg total) by  mouth daily with breakfast.    Dispense:  30 tablet    Refill:  1   Patient Instructions    New med jardiance may help blood sugar control. Continue same dose lantus and metformin for now.  Watch for low blood sugar symptoms as we have discussed in the past. If those occur, we may need to change your meds.if med too expensive (can try discount card), let me know.   Blood pressure elevated here, but based on home readings can wait to discuss any changes with your cardiologist in a few days.  Recheck with me in 1 month for repeat A1c testing.  Let me know if there are questions in the meantime.   If you have lab work done today you will be contacted with your lab results within the next 2 weeks.  If you have not heard from [place] then please contact us. The fastest way to get your results is to register for My Chart.   IF you received an x-ray today, you will receive an invoice from [facility] Radiology. Please contact [facility] Radiology at [phone] with questions or concerns regarding your invoice.   IF you received labwork today, you will receive an invoice from [place]. Please contact [facility] at [phone] with questions or concerns regarding your invoice.   Our billing staff will not be able to assist you with questions regarding bills from these companies.  You will be contacted with the lab results as soon as they are available. The fastest way to get your results is to activate your My Chart account. Instructions are located on the last page of this paperwork. If you have not heard from [place] regarding the results in 2 weeks, please contact this office.      Signed,   [name], MD [facility] [place].  06/08/18 12:36 PM

## 2018-06-09 LAB — MICROALBUMIN / CREATININE URINE RATIO
Creatinine, Urine: 123.4 mg/dL
Microalb/Creat Ratio: 17.3 mg/g creat (ref 0.0–30.0)
Microalbumin, Urine: 21.4 ug/mL

## 2018-06-10 ENCOUNTER — Encounter: Payer: Self-pay | Admitting: Family Medicine

## 2018-06-12 ENCOUNTER — Ambulatory Visit (INDEPENDENT_AMBULATORY_CARE_PROVIDER_SITE_OTHER): Payer: Medicare Other | Admitting: Cardiovascular Disease

## 2018-06-12 ENCOUNTER — Encounter: Payer: Self-pay | Admitting: Cardiovascular Disease

## 2018-06-12 VITALS — BP 142/78 | HR 66 | Ht 67.0 in | Wt 207.2 lb

## 2018-06-12 DIAGNOSIS — I5032 Chronic diastolic (congestive) heart failure: Secondary | ICD-10-CM | POA: Diagnosis not present

## 2018-06-12 DIAGNOSIS — I10 Essential (primary) hypertension: Secondary | ICD-10-CM | POA: Diagnosis not present

## 2018-06-12 NOTE — Assessment & Plan Note (Signed)
History of diastolic heart failure on furosemide aware of salt restriction.  Currently he is euvolemic wearing compression stockings and denies shortness of breath.

## 2018-06-12 NOTE — Patient Instructions (Signed)
Medication Instructions:  NO CHANGE If you need a refill on your cardiac medications before your next appointment, please call your pharmacy.   Lab work: NONE If you have labs (blood work) drawn today and your tests are completely normal, you will receive your results only by: Marland Kitchen MyChart Message (if you have MyChart) OR . A paper copy in the mail If you have any lab test that is abnormal or we need to change your treatment, we will call you to review the results.  Testing/Procedures: NONE  Follow-Up: At [facility], you and your health needs are our priority.  As part of our continuing mission to provide you with exceptional heart care, we have created designated Provider Care Teams.  These Care Teams include your primary Cardiologist (physician) and Advanced Practice Providers (APPs -  Physician Assistants and Nurse Practitioners) who all work together to provide you with the care you need, when you need it. You will need a follow up appointment in 12 months.  Please call our office 2 months in advance to schedule this appointment.  You may see DR Gwenlyn Found or one of the following Advanced Practice Providers on your designated Care Team:   Kerin Ransom, PA-C Roby Lofts, [place] . Sande Rives, PA-C

## 2018-06-12 NOTE — Assessment & Plan Note (Signed)
History of hyperlipidemia on statin therapy with lipid profile performed 01/01/2018 revealing cholesterol 194, LDL of 89 and HDL of 60.

## 2018-06-12 NOTE — Assessment & Plan Note (Signed)
History of essential hypertension blood pressure measured today 142/78.  He is on lisinopril and metoprolol as well as Maxide.  Continue current meds at current dosing.

## 2018-06-12 NOTE — Progress Notes (Signed)
06/12/2018 Steven Benjamin   1951/02/27  [id number]  Primary Physician [name], MD Primary Cardiologist: Lorretta Harp MD FACP, [place], [place], [place]  HPI:  Steven Benjamin is a 67 y.o.  mildly overweight married Caucasian male father of 2, grandfather and 2 grandchildren who I last saw him in the office 06/07/2017.  He was referred by Dr. Carlota Raspberry for cardiovascular evaluation because of pedal edema and heart failure. He has a history of treated hypertension and hyperlipidemia. No bradycardia or stroke. There is no family history. He does not smoke or drink. He is noted to have pedal edema and was begun on furosemide in addition to his Maxzide with resultant diuresis and improvement in his edema. 2-D echo performed 05/10/17 revealed normal LV systolic function, grade 1 diastolic dysfunction with normal valves. His BNP was measured at 1000. He feels quickly improved after brief diuresis on his short-term furosemide. He is now aware salt restriction.  Since I saw him a year ago he is remained stable.  He is on chronic oral diuretics.  He is aware of salt restriction.  Has had no further episodes of volume overload.  Denies chest pain or shortness of breath.  Current Meds  Medication Sig  . blood glucose meter kit and supplies Dispense based on patient and insurance preference. Use up to four times daily as directed. (FOR ICD-10 E10.9, E11.9).  . cetirizine (ZYRTEC) 10 MG tablet Take 10 mg by mouth daily. Reported on 07/07/2015  . furosemide (LASIX) 20 MG tablet Take 1 tablet (20 mg total) by mouth daily as needed.  Marland Kitchen glucose blood test strip Use as instructed, up to 3 times per day. DM2, uncontrolled with hyperglycemia, insulin dependent.  Marland Kitchen lisinopril (PRINIVIL,ZESTRIL) 40 MG tablet TAKE 1 TABLET(40 MG) BY MOUTH DAILY  . metFORMIN (GLUCOPHAGE XR) 500 MG 24 hr tablet Take 1 tablet (500 mg total) by mouth daily with breakfast.  . metoprolol succinate (TOPROL-XL) 100 MG 24 hr tablet Take 1 tablet  (100 mg total) by mouth daily. Take with or immediately following a meal.  . sertraline (ZOLOFT) 25 MG tablet Take 1 tablet (25 mg total) by mouth daily.  . simvastatin (ZOCOR) 20 MG tablet Take 1 tablet (20 mg total) by mouth at bedtime.  . triamterene-hydrochlorothiazide (MAXZIDE-25) 37.5-25 MG tablet Take 1 tablet by mouth daily.     Allergies  Allergen Reactions  . Lipitor [Atorvastatin Calcium] Other (See Comments)    Muscle pains  . Vibra-Tab [Doxycycline] Other (See Comments)    Muscle pain   . Z-Pak [Azithromycin]   . Erythromycin Rash    Social History   Socioeconomic History  . Marital status: Married    Spouse name: Not on file  . Number of children: 2  . Years of education: Not on file  . Highest education level: Not on file  Occupational History  . Not on file  Social Needs  . Financial resource strain: Not on file  . Food insecurity:    Worry: Not on file    Inability: Not on file  . Transportation needs:    Medical: Not on file    Non-medical: Not on file  Tobacco Use  . Smoking status: Former Research scientist (life sciences)  . Smokeless tobacco: Never Used  Substance and Sexual Activity  . Alcohol use: No  . Drug use: No  . Sexual activity: Not on file  Lifestyle  . Physical activity:    Days per week: Not on file  Minutes per session: Not on file  . Stress: Not on file  Relationships  . Social connections:    Talks on phone: Not on file    Gets together: Not on file    Attends religious service: Not on file    Active member of club or organization: Not on file    Attends meetings of clubs or organizations: Not on file    Relationship status: Not on file  . Intimate partner violence:    Fear of current or ex partner: Not on file    Emotionally abused: Not on file    Physically abused: Not on file    Forced sexual activity: Not on file  Other Topics Concern  . Not on file  Social History Narrative  . Not on file     Review of Systems: General: negative for  chills, fever, night sweats or weight changes.  Cardiovascular: negative for chest pain, dyspnea on exertion, edema, orthopnea, palpitations, paroxysmal nocturnal dyspnea or shortness of breath Dermatological: negative for rash Respiratory: negative for cough or wheezing Urologic: negative for hematuria Abdominal: negative for nausea, vomiting, diarrhea, bright red blood per rectum, melena, or hematemesis Neurologic: negative for visual changes, syncope, or dizziness All other systems reviewed and are otherwise negative except as noted above.    Blood pressure (!) 142/78, pulse 66, height '5\' 7"'  (1.702 m), weight 207 lb 3.2 oz (94 kg).  General appearance: alert and no distress Neck: no adenopathy, no carotid bruit, no JVD, supple, symmetrical, trachea midline and thyroid not enlarged, symmetric, no tenderness/mass/nodules Lungs: clear to auscultation bilaterally Heart: regular rate and rhythm, S1, S2 normal, no murmur, click, rub or gallop Extremities: extremities normal, atraumatic, no cyanosis or edema Pulses: 2+ and symmetric Skin: Skin color, texture, turgor normal. No rashes or lesions Neurologic: Alert and oriented X 3, normal strength and tone. Normal symmetric reflexes. Normal coordination and gait  EKG sinus rhythm at 66 without ST or T wave changes.  I personally reviewed this EKG  ASSESSMENT AND PLAN:   HYPERLIPIDEMIA History of hyperlipidemia on statin therapy with lipid profile performed 01/01/2018 revealing cholesterol 194, LDL of 89 and HDL of 60.  Essential hypertension History of essential hypertension blood pressure measured today 142/78.  He is on lisinopril and metoprolol as well as Maxide.  Continue current meds at current dosing.  Chronic diastolic heart failure (HCC) History of diastolic heart failure on furosemide aware of salt restriction.  Currently he is euvolemic wearing compression stockings and denies shortness of breath.      Lorretta Harp MD  FACP,FACC,FAHA, [facility] 06/12/2018 3:03 PM

## 2018-06-13 DIAGNOSIS — M1990 Unspecified osteoarthritis, unspecified site: Secondary | ICD-10-CM | POA: Diagnosis not present

## 2018-06-13 DIAGNOSIS — Z79899 Other long term (current) drug therapy: Secondary | ICD-10-CM | POA: Diagnosis not present

## 2018-06-13 DIAGNOSIS — M069 Rheumatoid arthritis, unspecified: Secondary | ICD-10-CM | POA: Diagnosis not present

## 2018-06-13 DIAGNOSIS — Z8739 Personal history of other diseases of the musculoskeletal system and connective tissue: Secondary | ICD-10-CM | POA: Diagnosis not present

## 2018-07-11 ENCOUNTER — Ambulatory Visit: Payer: Medicare Other | Admitting: Family Medicine

## 2019-02-11 ENCOUNTER — Telehealth: Payer: Self-pay | Admitting: Cardiovascular Disease

## 2019-02-11 MED ORDER — AMLODIPINE BESYLATE 5 MG PO TABS: 5.0000 mg | ORAL_TABLET | Freq: Every day | ORAL | 3 refills | Status: DC

## 2019-02-11 NOTE — Telephone Encounter (Signed)
Called patient back, he states that yesterday his blood pressure was 210/110, and then rechecked by his son who is Arts administrator and it was 190/118. Patient states he has not checked it today because his automatic cuff is not working correctly and his son is not with him right now.  Patient denies chest pain, swelling, but does mention having dizziness if outside doing work. He has cut out salt and no diet changes. Patient states he is taking Metoprolol 100 mg daily, Lisinopril 40 mg daily, and just recently on the 22nd of July he was started on Clonidine HCL 0.1 mg 1 tablet twice daily by his primary care. He states when he takes that medication he feels lightheaded and feels like he is going to pass out.   Patient made appointment with Dr.Berry on 08/12, but wanted to check with PharmD if any changes should be made now until then. Thank you!

## 2019-02-11 NOTE — Telephone Encounter (Signed)
  Pt c/o BP issue: STAT if pt c/o blurred vision, one-sided weakness or slurred speech  1. What are your last 5 BP readings?  210/110, on 02/10/19  190/118   2. Are you having any other symptoms (ex. Dizziness, headache, blurred vision, passed out)? Dizziness one day last week, almost passed out  3. What is your BP issue? BP is running too high

## 2019-02-11 NOTE — Telephone Encounter (Signed)
Called patient, he had already stopped the Clonidine last week and has not taken it since- patient will start Amlodipine and discuss with Dr.Berry at appointment.

## 2019-02-11 NOTE — Telephone Encounter (Signed)
Will need to wean off Clonidine if taking daily for the last 3 weeks.   Recommendation:  1. Start amlodipine 5mg  daily  2. Continue metoprolol , lisinopril and diuretic as prescribed  3. Take 1 table of clonidine daily for next 3 days, then discuss stop during f/u with DR Gwenlyn Found

## 2019-02-13 ENCOUNTER — Other Ambulatory Visit: Payer: Self-pay

## 2019-02-13 ENCOUNTER — Encounter: Payer: Self-pay | Admitting: Cardiovascular Disease

## 2019-02-13 ENCOUNTER — Ambulatory Visit (INDEPENDENT_AMBULATORY_CARE_PROVIDER_SITE_OTHER): Payer: Medicare HMO | Admitting: Cardiovascular Disease

## 2019-02-13 DIAGNOSIS — I5032 Chronic diastolic (congestive) heart failure: Secondary | ICD-10-CM | POA: Diagnosis not present

## 2019-02-13 DIAGNOSIS — I10 Essential (primary) hypertension: Secondary | ICD-10-CM

## 2019-02-13 MED ORDER — AMLODIPINE BESYLATE 5 MG PO TABS: 5.0000 mg | ORAL_TABLET | Freq: Every day | ORAL | 3 refills | Status: AC

## 2019-02-13 NOTE — Assessment & Plan Note (Signed)
Chronic diastolic heart failure on oral diuretics.  He has minimal peripheral edema

## 2019-02-13 NOTE — Progress Notes (Signed)
02/13/2019 [name]   23-Mar-1951  [id number]  Primary Physician Maris Berger, MD Primary Cardiologist: Lorretta Harp MD FACP, [place], [place], [place]  HPI:  Steven Benjamin is a 68 y.o.   mildly overweight married Caucasian male father of 2, grandfather and 2 grandchildren who I last saw him in the office 06/12/2018.  He was referred by Dr. Carlota Raspberry for cardiovascular evaluation because of pedal edema and heart failure. He has a history of treated hypertension and hyperlipidemia. No bradycardia or stroke. There is no family history. He does not smoke or drink. He is noted to have pedal edema and was begun on furosemide in addition to his Maxzide with resultant diuresis and improvement in his edema. 2-D echo performed 05/10/17 revealed normal LV systolic function, grade 1 diastolic dysfunction with normal valves. His BNP was measured at 1000. He feels quickly improved after brief diuresis on his short-term furosemide. He is now aware salt restriction. Since I saw him a year ago he is remained stable.  He is on chronic oral diuretics.  He is aware of salt restriction.  Has had no further episodes of volume overload.  Denies chest pain or shortness of breath.  His blood pressure control remains an issue.  His primary care physician just added clonidine 0.1 mg p.o. twice daily although his blood pressure remains elevated.   Current Meds  Medication Sig  . blood glucose meter kit and supplies Dispense based on patient and insurance preference. Use up to four times daily as directed. (FOR ICD-10 E10.9, E11.9).  . cetirizine (ZYRTEC) 10 MG tablet Take 10 mg by mouth daily. Reported on 07/07/2015  . cloNIDine (CATAPRES) 0.1 MG tablet Take 1 tablet by mouth 2 (two) times daily.  . furosemide (LASIX) 40 MG tablet Take 40 mg by mouth daily.  Marland Kitchen glucose blood test strip Use as instructed, up to 3 times per day. DM2, uncontrolled with hyperglycemia, insulin dependent.  Marland Kitchen lisinopril (PRINIVIL,ZESTRIL) 40 MG  tablet TAKE 1 TABLET(40 MG) BY MOUTH DAILY  . metFORMIN (GLUCOPHAGE XR) 500 MG 24 hr tablet Take 1 tablet (500 mg total) by mouth daily with breakfast.  . metoprolol succinate (TOPROL-XL) 100 MG 24 hr tablet Take 1 tablet (100 mg total) by mouth daily. Take with or immediately following a meal.  . simvastatin (ZOCOR) 20 MG tablet Take 1 tablet (20 mg total) by mouth at bedtime.     Allergies  Allergen Reactions  . Clonidine Derivatives Other (See Comments)    Dizziness  . Lipitor [Atorvastatin Calcium] Other (See Comments)    Muscle pains  . Vibra-Tab [Doxycycline] Other (See Comments)    Muscle pain   . Z-Pak [Azithromycin]   . Erythromycin Rash    Social History   Socioeconomic History  . Marital status: Married    Spouse name: Not on file  . Number of children: 2  . Years of education: Not on file  . Highest education level: Not on file  Occupational History  . Not on file  Social Needs  . Financial resource strain: Not on file  . Food insecurity    Worry: Not on file    Inability: Not on file  . Transportation needs    Medical: Not on file    Non-medical: Not on file  Tobacco Use  . Smoking status: Former Research scientist (life sciences)  . Smokeless tobacco: Never Used  Substance and Sexual Activity  . Alcohol use: No  . Drug use: No  .  Sexual activity: Not on file  Lifestyle  . Physical activity    Days per week: Not on file    Minutes per session: Not on file  . Stress: Not on file  Relationships  . Social Herbalist on phone: Not on file    Gets together: Not on file    Attends religious service: Not on file    Active member of club or organization: Not on file    Attends meetings of clubs or organizations: Not on file    Relationship status: Not on file  . Intimate partner violence    Fear of current or ex partner: Not on file    Emotionally abused: Not on file    Physically abused: Not on file    Forced sexual activity: Not on file  Other Topics Concern  . Not  on file  Social History Narrative  . Not on file     Review of Systems: General: negative for chills, fever, night sweats or weight changes.  Cardiovascular: negative for chest pain, dyspnea on exertion, edema, orthopnea, palpitations, paroxysmal nocturnal dyspnea or shortness of breath Dermatological: negative for rash Respiratory: negative for cough or wheezing Urologic: negative for hematuria Abdominal: negative for nausea, vomiting, diarrhea, bright red blood per rectum, melena, or hematemesis Neurologic: negative for visual changes, syncope, or dizziness All other systems reviewed and are otherwise negative except as noted above.    Blood pressure (!) 162/120, pulse 74, temperature 98.1 F (36.7 C), temperature source Temporal, height '5\' 7"'  (1.702 m), weight 204 lb 6.4 oz (92.7 kg).  General appearance: alert and no distress Neck: no adenopathy, no carotid bruit, no JVD, supple, symmetrical, trachea midline and thyroid not enlarged, symmetric, no tenderness/mass/nodules Lungs: clear to auscultation bilaterally Heart: regular rate and rhythm, S1, S2 normal, no murmur, click, rub or gallop Extremities: extremities normal, atraumatic, no cyanosis or edema Pulses: 2+ and symmetric Skin: Skin color, texture, turgor normal. No rashes or lesions Neurologic: Alert and oriented X 3, normal strength and tone. Normal symmetric reflexes. Normal coordination and gait  EKG normal sinus rhythm at 74 without ST or T wave changes.  I personally reviewed this EKG.  ASSESSMENT AND PLAN:   HYPERLIPIDEMIA History of hyperlipidemia on statin therapy with lipid profile performed 01/01/2018 revealing total cholesterol 194, LDL of 89 and HDL 60  Essential hypertension History of essential hypertension with on multiple antihypertensive and medications with blood pressure measured today of 162/120.  He is on lisinopril, metoprolol and clonidine which was recently added.  I am going to add low-dose  amlodipine 5 mg a day and will have him keep a blood pressure log daily for a month.  Cyril Mourning will call him back after that to review make recommendations.  We could also add hydralazine as well.  Chronic diastolic heart failure (HCC) Chronic diastolic heart failure on oral diuretics.  He has minimal peripheral edema      Lorretta Harp MD [facility], [facility] 02/13/2019 2:57 PM

## 2019-02-13 NOTE — Assessment & Plan Note (Signed)
History of essential hypertension with on multiple antihypertensive and medications with blood pressure measured today of 162/120.  He is on lisinopril, metoprolol and clonidine which was recently added.  I am going to add low-dose amlodipine 5 mg a day and will have him keep a blood pressure log daily for a month.  Cyril Mourning will call him back after that to review make recommendations.  We could also add hydralazine as well.

## 2019-02-13 NOTE — Patient Instructions (Addendum)
Medication Instructions:  Your physician has recommended you make the following change in your medication:  CONTINUE AMLODIPINE 5 MG BY MOUTH DAILY If you need a refill on your cardiac medications before your next appointment, please call your pharmacy.   Lab work: none If you have labs (blood work) drawn today and your tests are completely normal, you will receive your results only by: Marland Kitchen MyChart Message (if you have MyChart) OR . A paper copy in the mail If you have any lab test that is abnormal or we need to change your treatment, we will call you to review the results.  Testing/Procedures: none  Follow-Up: At [facility], you and your health needs are our priority.  As part of our continuing mission to provide you with exceptional heart care, we have created designated Provider Care Teams.  These Care Teams include your primary Cardiologist (physician) and Advanced Practice Providers (APPs -  Physician Assistants and Nurse Practitioners) who all work together to provide you with the care you need, when you need it. . You will need a follow up appointment in 3 months with an APP and in 12 months with Dr. Quay Burow.  Please call our office 2 months in advance to schedule this/each appointment.  You may see one of the following Advanced Practice Providers on your designated Care Team:   . Kerin Ransom, PA-C . Daleen Snook Kroeger, PA-C . Sande Rives, PA-C   Any Other Special Instructions Will Be Listed Below (If Applicable). KEEP A BLOOD PRESSURE LOG FOR 30 DAYS THEN FOLLOW UP WITH A CLINICAL PHARMACIST IN THE HYPERTENSION CLINIC. YOU WILL NEED AN APPOINTMENT.

## 2019-02-13 NOTE — Assessment & Plan Note (Signed)
History of hyperlipidemia on statin therapy with lipid profile performed 01/01/2018 revealing total cholesterol 194, LDL of 89 and HDL 60

## 2019-02-14 NOTE — Addendum Note (Signed)
Addended by: Therisa Doyne on: 02/14/2019 04:51 PM   Modules accepted: Orders

## 2019-05-14 ENCOUNTER — Encounter: Payer: Self-pay | Admitting: Cardiology

## 2019-05-14 ENCOUNTER — Telehealth: Payer: Self-pay

## 2019-05-14 ENCOUNTER — Telehealth (INDEPENDENT_AMBULATORY_CARE_PROVIDER_SITE_OTHER): Payer: Medicare HMO | Admitting: Cardiology

## 2019-05-14 VITALS — BP 135/77 | HR 60 | Ht 66.0 in | Wt 213.0 lb

## 2019-05-14 DIAGNOSIS — E119 Type 2 diabetes mellitus without complications: Secondary | ICD-10-CM | POA: Insufficient documentation

## 2019-05-14 DIAGNOSIS — I10 Essential (primary) hypertension: Secondary | ICD-10-CM

## 2019-05-14 DIAGNOSIS — E785 Hyperlipidemia, unspecified: Secondary | ICD-10-CM | POA: Insufficient documentation

## 2019-05-14 DIAGNOSIS — N183 Chronic kidney disease, stage 3 unspecified: Secondary | ICD-10-CM | POA: Insufficient documentation

## 2019-05-14 NOTE — Progress Notes (Signed)
Virtual Visit via Telephone Note   This visit type was conducted due to national recommendations for restrictions regarding the COVID-19 Pandemic (e.g. social distancing) in an effort to limit this patient's exposure and mitigate transmission in our community.  Due to his co-morbid illnesses, this patient is at least at moderate risk for complications without adequate follow up.  This format is felt to be most appropriate for this patient at this time.  The patient did not have access to video technology/had technical difficulties with video requiring transitioning to audio format only (telephone).  All issues noted in this document were discussed and addressed.  No physical exam could be performed with this format.  Please refer to the patient's chart for his  consent to telehealth for [facility].   Date:  05/14/2019   ID:  [name], DOB 21-Nov-1950, MRN [id number]  Patient Location: Home Provider Location: Home  PCP:  Maris Berger, MD  Cardiologist:  Dr Gwenlyn Found Electrophysiologist:  None   Evaluation Performed:  Follow-Up Visit  Chief Complaint:  none  History of Present Illness:    Steven Benjamin is a 68 y.o. male with a history of difficult to control hypertension, non-insulin-dependent diabetes, dyslipidemia, rheumatoid arthritis, and chronic renal insufficiency stage II-III.  Patient was referred to Dr. Gwenlyn Found for evaluation of possible hypertensive heart disease.  Echocardiogram November 2018 showed normal LV function with mild LVH and grade 1 diastolic dysfunction.  At his last office visit in August Dr. Gwenlyn Found added amlodipine 5 mg.   The patient has since been referred to a nephrologist at [facility].  They are currently managing his hypertension.  His amlodipine was increased to 10 mg a day and now his blood pressures under pretty good control, he reports his systolic runs around 361-443 systolic and diastolics in the 15Q.  He has mild ankle edema but otherwise has tolerated  this dose well.   The patient does not have symptoms concerning for COVID-19 infection (fever, chills, cough, or new shortness of breath).    Past Medical History:  Diagnosis Date   Hypertension    Kidney stone    No past surgical history on file.   Current Meds  Medication Sig   amLODipine (NORVASC) 5 MG tablet Take 1 tablet (5 mg total) by mouth daily.   blood glucose meter kit and supplies Dispense based on patient and insurance preference. Use up to four times daily as directed. (FOR ICD-10 E10.9, E11.9).   cetirizine (ZYRTEC) 10 MG tablet Take 10 mg by mouth daily. Reported on 07/07/2015   furosemide (LASIX) 40 MG tablet Take 40 mg by mouth daily.   glucose blood test strip Use as instructed, up to 3 times per day. DM2, uncontrolled with hyperglycemia, insulin dependent.   lisinopril (PRINIVIL,ZESTRIL) 40 MG tablet TAKE 1 TABLET(40 MG) BY MOUTH DAILY   metFORMIN (GLUCOPHAGE XR) 500 MG 24 hr tablet Take 1 tablet (500 mg total) by mouth daily with breakfast.   metoprolol succinate (TOPROL-XL) 100 MG 24 hr tablet Take 1 tablet (100 mg total) by mouth daily. Take with or immediately following a meal.   NON FORMULARY SOUR CHERRY EXTRACT TAKE 3 TABLET A DAY AS NEEDED FOR GOUT   simvastatin (ZOCOR) 20 MG tablet Take 1 tablet (20 mg total) by mouth at bedtime.     Allergies:   Atorvastatin calcium, Clonidine derivatives, Lipitor [atorvastatin calcium], Vibra-tab [doxycycline], Z-pak [azithromycin], and Erythromycin   Social History   Tobacco Use   Smoking  status: Former Smoker   Smokeless tobacco: Never Used  Substance Use Topics   Alcohol use: No   Drug use: No     Family Hx: The patient's family history includes Cancer in his mother; Heart disease in his father.  ROS:   Please see the history of present illness.    All other systems reviewed and are negative.   Prior CV studies:   The following studies were reviewed today: Echo nov 2018  Labs/Other Tests  and Data Reviewed:    EKG:  No ECG reviewed.  Recent Labs: No results found for requested labs within last 8760 hours.   Recent Lipid Panel Lab Results  Component Value Date/Time   CHOL 194 01/01/2018 10:14 AM   TRIG 223 (H) 01/01/2018 10:14 AM   TRIG 168 (H) 06/19/2006 08:01 AM   HDL 60 01/01/2018 10:14 AM   CHOLHDL 3.2 01/01/2018 10:14 AM   CHOLHDL 2.4 09/17/2011 02:30 PM   LDLCALC 89 01/01/2018 10:14 AM   LDLDIRECT 140.5 06/19/2006 08:01 AM    Wt Readings from Last 3 Encounters:  05/14/19 213 lb (96.6 kg)  02/13/19 204 lb 6.4 oz (92.7 kg)  06/12/18 207 lb 3.2 oz (94 kg)     Objective:    Vital Signs:  BP 135/77    Pulse 60    Ht '5\' 6"'  (1.676 m)    Wt 213 lb (96.6 kg)    BMI 34.38 kg/m    VITAL SIGNS:  reviewed  ASSESSMENT & PLAN:    HTN- Currently controlled- mild LVH and grade 1 DD on echo.  NIDDM- On oral agents  CRI- GRR 53 in August 200- he is being followed by a nephrologist at [facility]   HLD- On statin Rx- PCP follows.  COVID-19 Education: The signs and symptoms of COVID-19 were discussed with the patient and how to seek care for testing (follow up with PCP or arrange E-visit).  The importance of social distancing was discussed today.  Time:   Today, I have spent 15 minutes with the patient with telehealth technology discussing the above problems.     Medication Adjustments/Labs and Tests Ordered: Current medicines are reviewed at length with the patient today.  Concerns regarding medicines are outlined above.   Tests Ordered: No orders of the defined types were placed in this encounter.   Medication Changes: No orders of the defined types were placed in this encounter.   Follow Up:  PRN follow up with Dr Gwenlyn Found prn  Signed, Kerin Ransom, PA-C  05/14/2019 10:15 AM    [place]

## 2019-05-14 NOTE — Telephone Encounter (Signed)

## 2019-05-14 NOTE — Patient Instructions (Signed)
Medication Instructions:  Your physician recommends that you continue on your current medications as directed. Please refer to the Current Medication list given to you today. *If you need a refill on your cardiac medications before your next appointment, please call your pharmacy*  Lab Work: None  If you have labs (blood work) drawn today and your tests are completely normal, you will receive your results only by: Marland Kitchen MyChart Message (if you have MyChart) OR . A paper copy in the mail If you have any lab test that is abnormal or we need to change your treatment, we will call you to review the results.  Testing/Procedures: None   Follow-Up: At [facility], you and your health needs are our priority.  As part of our continuing mission to provide you with exceptional heart care, we have created designated Provider Care Teams.  These Care Teams include your primary Cardiologist (physician) and Advanced Practice Providers (APPs -  Physician Assistants and Nurse Practitioners) who all work together to provide you with the care you need, when you need it.  Your next appointment:   ON A AS NEEDED BASIS  The format for your next appointment:   Either In Person or Virtual  Provider:   You may see Quay Burow, MD or one of the following Advanced Practice Providers on your designated Care Team:    Kerin Ransom, PA-C  [place], [place]  Coletta Memos, [place]   Other Instructions

## 2019-05-14 NOTE — Telephone Encounter (Signed)
Contacted patient to discuss AVS Instructions. Gave patient Luke's recommendations from today's virtual office visit. Advised patient to follow up with our office on a as needed basis. Patient voiced understanding and AVS mailed.

## 2019-08-04 IMAGING — DX DG CHEST 2V
2 series · 2 of 2 positions shown · non-contrast
Comparison: 06/29/2015 chest radiate

CLINICAL DATA: 66 y/o  M; elevated blood pressure and orthopnea.

EXAM:
CHEST  2 VIEW

[chest pa]
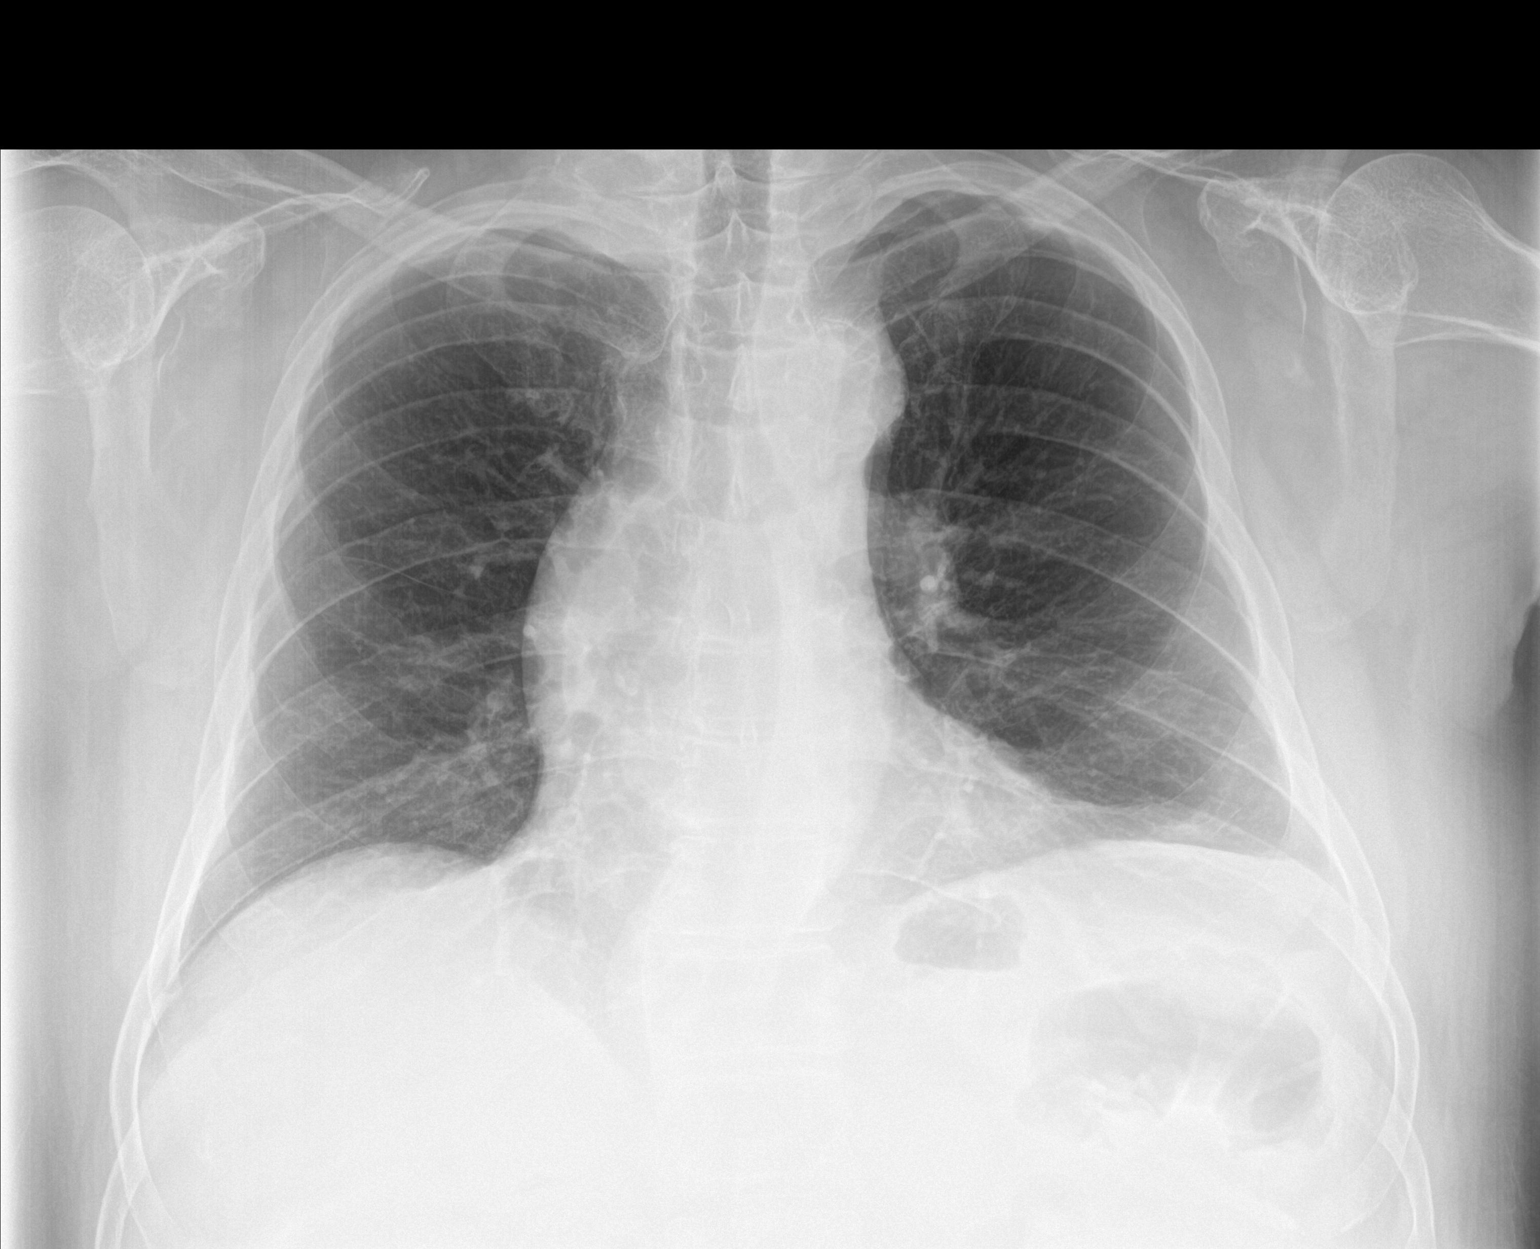

[chest lat]
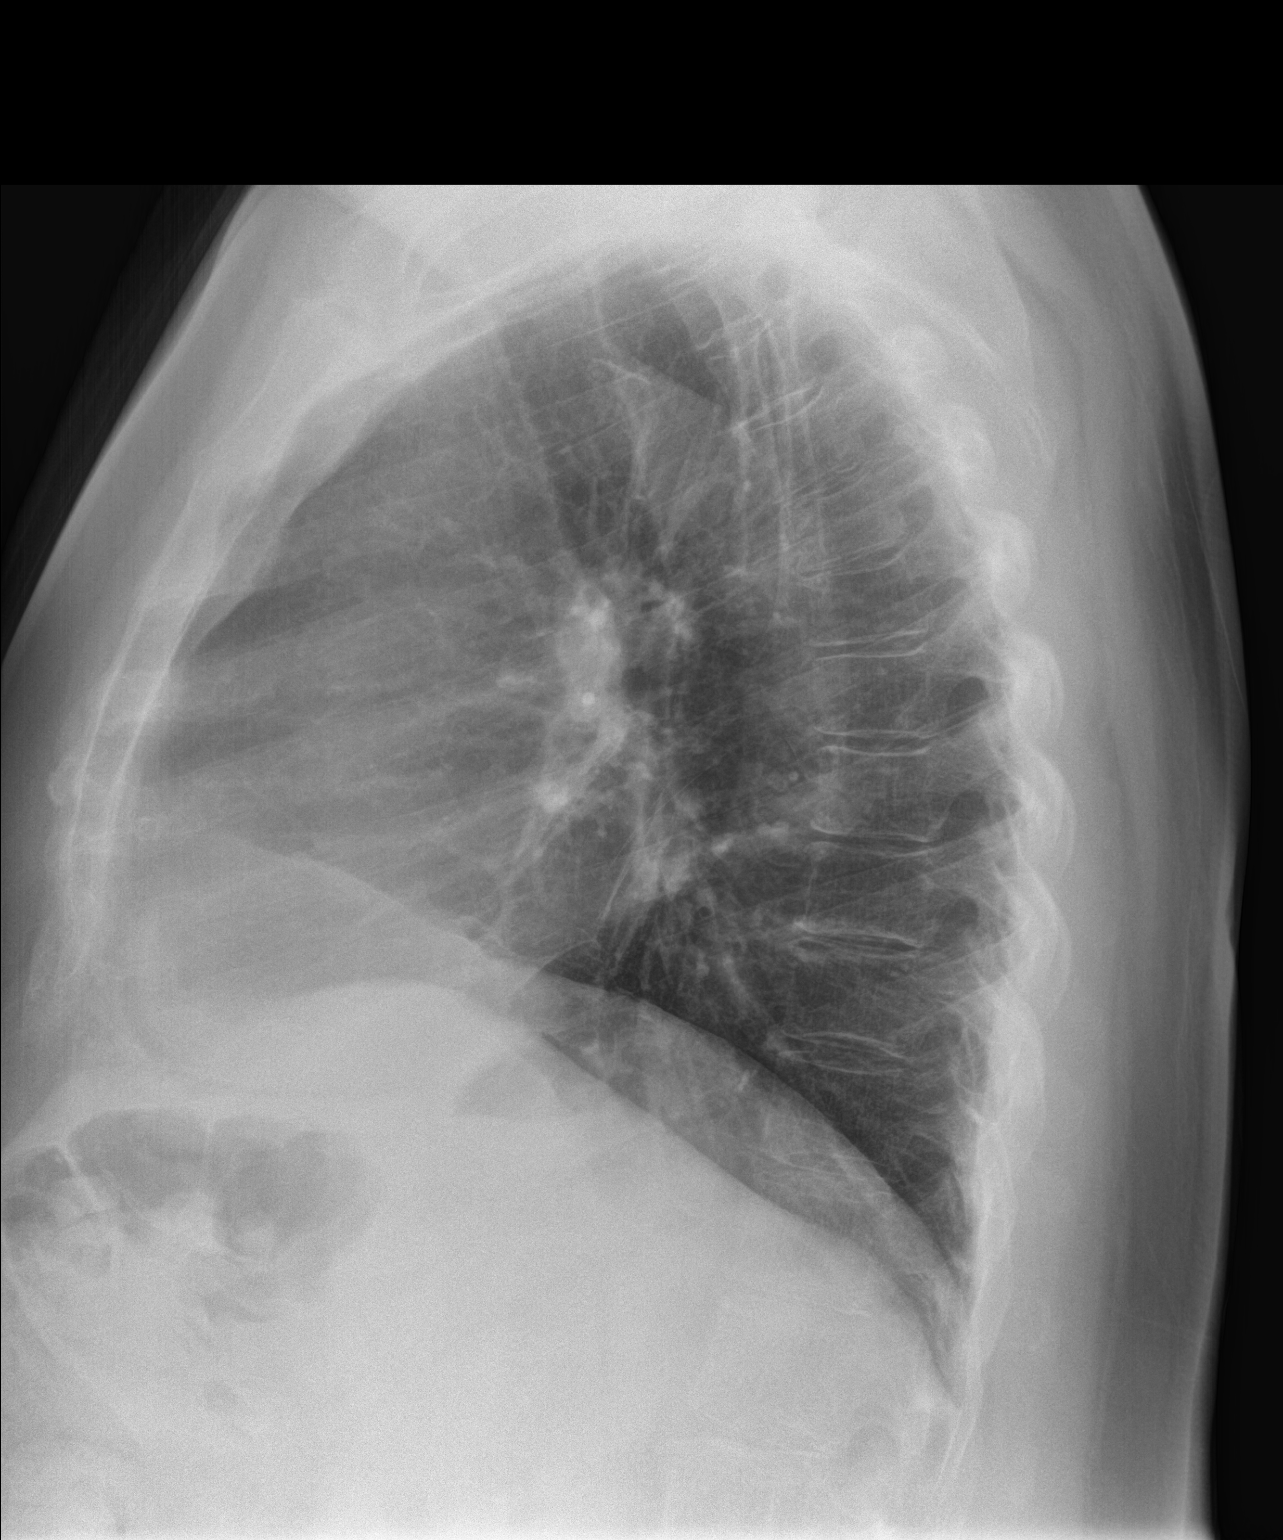

[2 of 2 positions shown; findings below may reference images not displayed]

FINDINGS: The heart size and mediastinal contours are within normal limits.
Stable tortuous aorta. Both lungs are clear. The visualized skeletal
structures are unremarkable.
IMPRESSION: Stable tortuous aorta.  No acute pulmonary process identified.

By: Prince Adeleke Beyioku M.D.

## 2021-08-17 ENCOUNTER — Encounter: Payer: Self-pay | Admitting: Cardiovascular Disease
